# Patient Record
Sex: Female | Born: 1935
Health system: Southern US, Community
[De-identification: ages and names within clinical notes are randomized; demographics above are authoritative.]

## PROBLEM LIST (undated history)

## (undated) DIAGNOSIS — R0600 Dyspnea, unspecified: Secondary | ICD-10-CM

## (undated) DIAGNOSIS — R42 Dizziness and giddiness: Secondary | ICD-10-CM

## (undated) DIAGNOSIS — D649 Anemia, unspecified: Secondary | ICD-10-CM

## (undated) DIAGNOSIS — Z9889 Other specified postprocedural states: Secondary | ICD-10-CM

## (undated) DIAGNOSIS — I4891 Unspecified atrial fibrillation: Secondary | ICD-10-CM

## (undated) DIAGNOSIS — M5106 Intervertebral disc disorders with myelopathy, lumbar region: Secondary | ICD-10-CM

## (undated) DIAGNOSIS — R112 Nausea with vomiting, unspecified: Secondary | ICD-10-CM

## (undated) DIAGNOSIS — E039 Hypothyroidism, unspecified: Secondary | ICD-10-CM

## (undated) DIAGNOSIS — I10 Essential (primary) hypertension: Secondary | ICD-10-CM

## (undated) DIAGNOSIS — K219 Gastro-esophageal reflux disease without esophagitis: Secondary | ICD-10-CM

## (undated) DIAGNOSIS — M810 Age-related osteoporosis without current pathological fracture: Secondary | ICD-10-CM

## (undated) DIAGNOSIS — M1990 Unspecified osteoarthritis, unspecified site: Secondary | ICD-10-CM

## (undated) DIAGNOSIS — R011 Cardiac murmur, unspecified: Secondary | ICD-10-CM

## (undated) DIAGNOSIS — I639 Cerebral infarction, unspecified: Secondary | ICD-10-CM

## (undated) DIAGNOSIS — F419 Anxiety disorder, unspecified: Secondary | ICD-10-CM

## (undated) DIAGNOSIS — M5136 Other intervertebral disc degeneration, lumbar region: Secondary | ICD-10-CM

## (undated) DIAGNOSIS — E785 Hyperlipidemia, unspecified: Secondary | ICD-10-CM

## (undated) HISTORY — DX: Hypothyroidism, unspecified: E03.9

## (undated) HISTORY — DX: Cardiac murmur, unspecified: R01.1

## (undated) HISTORY — DX: Intervertebral disc disorders with myelopathy, lumbar region: M51.06

## (undated) HISTORY — DX: Hyperlipidemia, unspecified: E78.5

## (undated) HISTORY — PX: EYE SURGERY: SHX253

## (undated) HISTORY — PX: EXCISION MORTON'S NEUROMA: SHX5013

## (undated) HISTORY — DX: Age-related osteoporosis without current pathological fracture: M81.0

## (undated) HISTORY — DX: Unspecified atrial fibrillation: I48.91

---
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
## 1973-03-11 HISTORY — PX: ABDOMINAL HYSTERECTOMY: SHX81

## 2003-03-12 DIAGNOSIS — I639 Cerebral infarction, unspecified: Secondary | ICD-10-CM

## 2003-03-12 HISTORY — DX: Cerebral infarction, unspecified: I63.9

## 2003-05-05 ENCOUNTER — Other Ambulatory Visit: Payer: Self-pay

## 2003-12-22 ENCOUNTER — Emergency Department: Payer: Self-pay | Admitting: Unknown Physician Specialty

## 2003-12-22 ENCOUNTER — Other Ambulatory Visit: Payer: Self-pay

## 2004-02-06 ENCOUNTER — Ambulatory Visit: Payer: Self-pay | Admitting: Unknown Physician Specialty

## 2004-03-07 ENCOUNTER — Ambulatory Visit: Payer: Self-pay | Admitting: Internal Medicine

## 2004-03-16 ENCOUNTER — Ambulatory Visit: Payer: Self-pay | Admitting: Internal Medicine

## 2004-09-17 ENCOUNTER — Ambulatory Visit: Payer: Self-pay | Admitting: Internal Medicine

## 2005-04-03 ENCOUNTER — Ambulatory Visit: Payer: Self-pay | Admitting: Internal Medicine

## 2006-04-04 ENCOUNTER — Ambulatory Visit: Payer: Self-pay | Admitting: Internal Medicine

## 2007-04-07 ENCOUNTER — Ambulatory Visit: Payer: Self-pay | Admitting: Internal Medicine

## 2009-03-06 ENCOUNTER — Ambulatory Visit: Payer: Self-pay | Admitting: Internal Medicine

## 2009-08-03 ENCOUNTER — Ambulatory Visit: Payer: Self-pay | Admitting: Internal Medicine

## 2010-03-07 ENCOUNTER — Ambulatory Visit: Payer: Self-pay | Admitting: Internal Medicine

## 2010-07-11 ENCOUNTER — Ambulatory Visit: Payer: Self-pay | Admitting: Internal Medicine

## 2011-04-24 ENCOUNTER — Ambulatory Visit: Payer: Self-pay | Admitting: Internal Medicine

## 2011-12-14 ENCOUNTER — Emergency Department: Payer: Self-pay | Admitting: Emergency Medicine

## 2011-12-14 LAB — CBC
HCT: 27.5 % — ABNORMAL LOW (ref 35.0–47.0)
MCH: 31.4 pg (ref 26.0–34.0)
MCV: 92 fL (ref 80–100)
Platelet: 145 10*3/uL — ABNORMAL LOW (ref 150–440)
RDW: 14.3 % (ref 11.5–14.5)
WBC: 9.2 10*3/uL (ref 3.6–11.0)

## 2012-06-22 ENCOUNTER — Ambulatory Visit: Payer: Self-pay | Admitting: Internal Medicine

## 2012-09-05 ENCOUNTER — Ambulatory Visit: Payer: Self-pay | Admitting: Internal Medicine

## 2013-06-27 DIAGNOSIS — M5106 Intervertebral disc disorders with myelopathy, lumbar region: Secondary | ICD-10-CM | POA: Insufficient documentation

## 2013-06-27 DIAGNOSIS — I351 Nonrheumatic aortic (valve) insufficiency: Secondary | ICD-10-CM | POA: Insufficient documentation

## 2013-08-16 DIAGNOSIS — E039 Hypothyroidism, unspecified: Secondary | ICD-10-CM

## 2013-08-16 HISTORY — DX: Hypothyroidism, unspecified: E03.9

## 2015-08-29 DIAGNOSIS — I63321 Cerebral infarction due to thrombosis of right anterior cerebral artery: Secondary | ICD-10-CM | POA: Diagnosis not present

## 2015-08-29 DIAGNOSIS — M5106 Intervertebral disc disorders with myelopathy, lumbar region: Secondary | ICD-10-CM | POA: Diagnosis not present

## 2015-08-29 DIAGNOSIS — M810 Age-related osteoporosis without current pathological fracture: Secondary | ICD-10-CM | POA: Diagnosis not present

## 2015-08-29 DIAGNOSIS — I10 Essential (primary) hypertension: Secondary | ICD-10-CM | POA: Diagnosis not present

## 2015-08-29 DIAGNOSIS — E782 Mixed hyperlipidemia: Secondary | ICD-10-CM | POA: Diagnosis not present

## 2016-01-05 DIAGNOSIS — S3992XA Unspecified injury of lower back, initial encounter: Secondary | ICD-10-CM | POA: Diagnosis not present

## 2016-01-05 DIAGNOSIS — M5116 Intervertebral disc disorders with radiculopathy, lumbar region: Secondary | ICD-10-CM | POA: Diagnosis not present

## 2016-01-05 DIAGNOSIS — N3090 Cystitis, unspecified without hematuria: Secondary | ICD-10-CM | POA: Diagnosis not present

## 2016-01-10 ENCOUNTER — Other Ambulatory Visit: Payer: Self-pay | Admitting: Internal Medicine

## 2016-01-10 DIAGNOSIS — S22000A Wedge compression fracture of unspecified thoracic vertebra, initial encounter for closed fracture: Secondary | ICD-10-CM

## 2016-01-12 ENCOUNTER — Encounter (INDEPENDENT_AMBULATORY_CARE_PROVIDER_SITE_OTHER): Payer: Self-pay

## 2016-01-12 ENCOUNTER — Ambulatory Visit
Admission: RE | Admit: 2016-01-12 | Discharge: 2016-01-12 | Disposition: A | Discharge status: Home / Self Care | Payer: PPO | Source: Ambulatory Visit | Attending: Internal Medicine | Admitting: Internal Medicine

## 2016-01-12 DIAGNOSIS — M4854XA Collapsed vertebra, not elsewhere classified, thoracic region, initial encounter for fracture: Secondary | ICD-10-CM | POA: Diagnosis not present

## 2016-01-12 DIAGNOSIS — W19XXXA Unspecified fall, initial encounter: Secondary | ICD-10-CM | POA: Insufficient documentation

## 2016-01-12 DIAGNOSIS — S22080A Wedge compression fracture of T11-T12 vertebra, initial encounter for closed fracture: Secondary | ICD-10-CM | POA: Diagnosis not present

## 2016-01-12 DIAGNOSIS — S22000A Wedge compression fracture of unspecified thoracic vertebra, initial encounter for closed fracture: Secondary | ICD-10-CM

## 2016-01-15 ENCOUNTER — Encounter
Admission: RE | Admit: 2016-01-15 | Discharge: 2016-01-15 | Disposition: A | Discharge status: Home / Self Care | Payer: PPO | Source: Ambulatory Visit | Attending: Orthopedic Surgery | Admitting: Orthopedic Surgery

## 2016-01-15 DIAGNOSIS — E039 Hypothyroidism, unspecified: Secondary | ICD-10-CM | POA: Diagnosis not present

## 2016-01-15 DIAGNOSIS — M5136 Other intervertebral disc degeneration, lumbar region: Secondary | ICD-10-CM | POA: Diagnosis not present

## 2016-01-15 DIAGNOSIS — Z91013 Allergy to seafood: Secondary | ICD-10-CM | POA: Diagnosis not present

## 2016-01-15 DIAGNOSIS — Z8673 Personal history of transient ischemic attack (TIA), and cerebral infarction without residual deficits: Secondary | ICD-10-CM | POA: Diagnosis not present

## 2016-01-15 DIAGNOSIS — M4854XA Collapsed vertebra, not elsewhere classified, thoracic region, initial encounter for fracture: Secondary | ICD-10-CM | POA: Diagnosis not present

## 2016-01-15 DIAGNOSIS — F419 Anxiety disorder, unspecified: Secondary | ICD-10-CM | POA: Diagnosis not present

## 2016-01-15 DIAGNOSIS — R011 Cardiac murmur, unspecified: Secondary | ICD-10-CM | POA: Diagnosis not present

## 2016-01-15 DIAGNOSIS — I10 Essential (primary) hypertension: Secondary | ICD-10-CM | POA: Diagnosis not present

## 2016-01-15 DIAGNOSIS — M810 Age-related osteoporosis without current pathological fracture: Secondary | ICD-10-CM | POA: Diagnosis not present

## 2016-01-15 DIAGNOSIS — M1990 Unspecified osteoarthritis, unspecified site: Secondary | ICD-10-CM | POA: Diagnosis not present

## 2016-01-15 DIAGNOSIS — Z885 Allergy status to narcotic agent status: Secondary | ICD-10-CM | POA: Diagnosis not present

## 2016-01-15 DIAGNOSIS — D649 Anemia, unspecified: Secondary | ICD-10-CM | POA: Diagnosis not present

## 2016-01-15 DIAGNOSIS — E785 Hyperlipidemia, unspecified: Secondary | ICD-10-CM | POA: Diagnosis not present

## 2016-01-15 DIAGNOSIS — K219 Gastro-esophageal reflux disease without esophagitis: Secondary | ICD-10-CM | POA: Diagnosis not present

## 2016-01-15 HISTORY — DX: Dizziness and giddiness: R42

## 2016-01-15 HISTORY — DX: Dyspnea, unspecified: R06.00

## 2016-01-15 HISTORY — DX: Gastro-esophageal reflux disease without esophagitis: K21.9

## 2016-01-15 HISTORY — DX: Essential (primary) hypertension: I10

## 2016-01-15 HISTORY — DX: Anemia, unspecified: D64.9

## 2016-01-15 HISTORY — DX: Unspecified osteoarthritis, unspecified site: M19.90

## 2016-01-15 HISTORY — DX: Cerebral infarction, unspecified: I63.9

## 2016-01-15 HISTORY — DX: Anxiety disorder, unspecified: F41.9

## 2016-01-15 HISTORY — DX: Other intervertebral disc degeneration, lumbar region: M51.36

## 2016-01-15 HISTORY — DX: Other specified postprocedural states: R11.2

## 2016-01-15 HISTORY — DX: Hypothyroidism, unspecified: E03.9

## 2016-01-15 HISTORY — DX: Other specified postprocedural states: Z98.890

## 2016-01-15 LAB — CBC
HEMATOCRIT: 39.7 % (ref 35.0–47.0)
HEMOGLOBIN: 13.4 g/dL (ref 12.0–16.0)
MCH: 30.2 pg (ref 26.0–34.0)
MCHC: 33.7 g/dL (ref 32.0–36.0)
MCV: 89.7 fL (ref 80.0–100.0)
Platelets: 168 10*3/uL (ref 150–440)
RBC: 4.43 MIL/uL (ref 3.80–5.20)
RDW: 13.6 % (ref 11.5–14.5)
WBC: 7.1 10*3/uL (ref 3.6–11.0)

## 2016-01-15 LAB — BASIC METABOLIC PANEL
ANION GAP: 7 (ref 5–15)
BUN: 27 mg/dL — AB (ref 6–20)
CHLORIDE: 104 mmol/L (ref 101–111)
CO2: 26 mmol/L (ref 22–32)
Calcium: 10.2 mg/dL (ref 8.9–10.3)
Creatinine, Ser: 0.82 mg/dL (ref 0.44–1.00)
GFR calc Af Amer: >60 mL/min (ref >60)
GLUCOSE: 105 mg/dL — AB (ref 65–99)
POTASSIUM: 3.8 mmol/L (ref 3.5–5.1)
Sodium: 137 mmol/L (ref 135–145)

## 2016-01-15 LAB — SURGICAL PCR SCREEN
MRSA, PCR: NEGATIVE
Staphylococcus aureus: NEGATIVE

## 2016-01-15 NOTE — Patient Instructions (Signed)
  Your procedure is scheduled on: January 16, 2016 (Tuesday) Report to Same Day Surgery 2nd floor medical mall ARRIVAL TIME 11:00 AM   Remember: Instructions that are not followed completely may result in serious medical risk, up to and including death, or upon the discretion of your surgeon and anesthesiologist your surgery may need to be rescheduled.    _x___ 1. Do not eat food or drink liquids after midnight. No gum chewing or hard candies.     __x__ 2. No Alcohol for 24 hours before or after surgery.   __x__3. No Smoking for 24 prior to surgery.   ____  4. Bring all medications with you on the day of surgery if instructed.    __x__ 5. Notify your doctor if there is any change in your medical condition     (cold, fever, infections).     Do not wear jewelry, make-up, hairpins, clips or nail polish.  Do not wear lotions, powders, or perfumes. You may wear deodorant.  Do not shave 48 hours prior to surgery. Men may shave face and neck.  Do not bring valuables to the hospital.    [facility] is not responsible for any belongings or valuables.               Contacts, dentures or bridgework may not be worn into surgery.  Leave your suitcase in the car. After surgery it may be brought to your room.  For patients admitted to the hospital, discharge time is determined by your treatment team.   Patients discharged the day of surgery will not be allowed to drive home.    Please read over the following fact sheets that you were given:   [facility] Preparing for Surgery and or MRSA Information   _x___ Take these medicines the morning of surgery with A SIP OF WATER:    1. Amlodipine  2. Sertraline  3.  Levothyroxine  4. Omeprazole (Omeprazole at bedtime tonight)  5.  6.  ____Fleets enema or Magnesium Citrate as directed.   _x___ Use CHG Soap or sage wipes as directed on instruction sheet   ____ Use inhalers on the day of surgery and bring to hospital day of surgery  ____ Stop  metformin 2 days prior to surgery    ____ Take 1/2 of usual insulin dose the night before surgery and none on the morning of  surgery.           __x__ Stop aspirin or coumadin, or plavix  (NO ASPIRIN)  x__ Stop Anti-inflammatories such as Advil, Aleve, Ibuprofen, Motrin, Naproxen,          Naprosyn, Goodies powders or aspirin products. Ok to take Tylenol.   _x___ Stop supplements until after surgery.  (STOP VITAMIN C NOW)  ____ Bring C-Pap to the hospital.

## 2016-01-16 ENCOUNTER — Ambulatory Visit
Admission: RE | Admit: 2016-01-16 | Discharge: 2016-01-16 | Disposition: A | Discharge status: Home / Self Care | Payer: PPO | Source: Ambulatory Visit | Attending: Orthopedic Surgery | Admitting: Orthopedic Surgery

## 2016-01-16 ENCOUNTER — Ambulatory Visit: Payer: PPO | Admitting: Anesthesiology

## 2016-01-16 ENCOUNTER — Encounter
Admission: RE | Disposition: A | Discharge status: Home / Self Care | Payer: Self-pay | Source: Ambulatory Visit | Attending: Orthopedic Surgery

## 2016-01-16 ENCOUNTER — Ambulatory Visit: Payer: PPO

## 2016-01-16 ENCOUNTER — Encounter: Payer: Self-pay | Admitting: *Deleted

## 2016-01-16 DIAGNOSIS — Z885 Allergy status to narcotic agent status: Secondary | ICD-10-CM | POA: Insufficient documentation

## 2016-01-16 DIAGNOSIS — I10 Essential (primary) hypertension: Secondary | ICD-10-CM | POA: Diagnosis not present

## 2016-01-16 DIAGNOSIS — D649 Anemia, unspecified: Secondary | ICD-10-CM | POA: Insufficient documentation

## 2016-01-16 DIAGNOSIS — Z419 Encounter for procedure for purposes other than remedying health state, unspecified: Secondary | ICD-10-CM

## 2016-01-16 DIAGNOSIS — M1990 Unspecified osteoarthritis, unspecified site: Secondary | ICD-10-CM | POA: Insufficient documentation

## 2016-01-16 DIAGNOSIS — Z8673 Personal history of transient ischemic attack (TIA), and cerebral infarction without residual deficits: Secondary | ICD-10-CM | POA: Insufficient documentation

## 2016-01-16 DIAGNOSIS — M4854XA Collapsed vertebra, not elsewhere classified, thoracic region, initial encounter for fracture: Secondary | ICD-10-CM | POA: Diagnosis not present

## 2016-01-16 DIAGNOSIS — K219 Gastro-esophageal reflux disease without esophagitis: Secondary | ICD-10-CM | POA: Insufficient documentation

## 2016-01-16 DIAGNOSIS — M810 Age-related osteoporosis without current pathological fracture: Secondary | ICD-10-CM | POA: Insufficient documentation

## 2016-01-16 DIAGNOSIS — R011 Cardiac murmur, unspecified: Secondary | ICD-10-CM | POA: Insufficient documentation

## 2016-01-16 DIAGNOSIS — E039 Hypothyroidism, unspecified: Secondary | ICD-10-CM | POA: Insufficient documentation

## 2016-01-16 DIAGNOSIS — Z91013 Allergy to seafood: Secondary | ICD-10-CM | POA: Insufficient documentation

## 2016-01-16 DIAGNOSIS — E785 Hyperlipidemia, unspecified: Secondary | ICD-10-CM | POA: Insufficient documentation

## 2016-01-16 DIAGNOSIS — F419 Anxiety disorder, unspecified: Secondary | ICD-10-CM | POA: Insufficient documentation

## 2016-01-16 DIAGNOSIS — M5136 Other intervertebral disc degeneration, lumbar region: Secondary | ICD-10-CM | POA: Insufficient documentation

## 2016-01-16 DIAGNOSIS — S22080A Wedge compression fracture of T11-T12 vertebra, initial encounter for closed fracture: Secondary | ICD-10-CM | POA: Diagnosis not present

## 2016-01-16 HISTORY — PX: KYPHOPLASTY: SHX5884

## 2016-01-16 SURGERY — KYPHOPLASTY
Anesthesia: General | Wound class: Clean

## 2016-01-16 MED ORDER — IOPAMIDOL (ISOVUE-M 200) INJECTION 41%
INTRAMUSCULAR | Status: AC
  Filled 2016-01-16: qty 20

## 2016-01-16 MED ORDER — PROPOFOL 500 MG/50ML IV EMUL
INTRAVENOUS | Status: DC | PRN
  Administered 2016-01-16: 25 ug/kg/min via INTRAVENOUS

## 2016-01-16 MED ORDER — OXYCODONE HCL 5 MG PO TABS: 5.0000 mg | ORAL_TABLET | Freq: Once | ORAL | Status: DC | PRN

## 2016-01-16 MED ORDER — CEFAZOLIN SODIUM-DEXTROSE 2-4 GM/100ML-% IV SOLN
INTRAVENOUS | Status: AC
  Filled 2016-01-16: qty 100

## 2016-01-16 MED ORDER — MEPERIDINE HCL 25 MG/ML IJ SOLN: 6.2500 mg | INTRAMUSCULAR | Status: DC | PRN

## 2016-01-16 MED ORDER — FENTANYL CITRATE (PF) 100 MCG/2ML IJ SOLN: 25.0000 ug | INTRAMUSCULAR | Status: DC | PRN

## 2016-01-16 MED ORDER — IOPAMIDOL (ISOVUE-M 200) INJECTION 41%
INTRAMUSCULAR | Status: DC | PRN
  Administered 2016-01-16: 20 mL

## 2016-01-16 MED ORDER — BUPIVACAINE-EPINEPHRINE (PF) 0.5% -1:200000 IJ SOLN
INTRAMUSCULAR | Status: AC
  Filled 2016-01-16: qty 30

## 2016-01-16 MED ORDER — LACTATED RINGERS IV SOLN
INTRAVENOUS | Status: DC
  Administered 2016-01-16: 12:00:00 via INTRAVENOUS

## 2016-01-16 MED ORDER — HYDROCODONE-ACETAMINOPHEN 5-325 MG PO TABS: 1.0000 | ORAL_TABLET | Freq: Four times a day (QID) | ORAL | 0 refills | Status: DC | PRN

## 2016-01-16 MED ORDER — BUPIVACAINE-EPINEPHRINE (PF) 0.5% -1:200000 IJ SOLN
INTRAMUSCULAR | Status: DC | PRN
  Administered 2016-01-16: 10 mL

## 2016-01-16 MED ORDER — PROMETHAZINE HCL 25 MG/ML IJ SOLN: 6.2500 mg | INTRAMUSCULAR | Status: DC | PRN

## 2016-01-16 MED ORDER — LIDOCAINE HCL (PF) 1 % IJ SOLN
INTRAMUSCULAR | Status: AC
  Filled 2016-01-16: qty 60

## 2016-01-16 MED ORDER — ONDANSETRON HCL 4 MG/2ML IJ SOLN
INTRAMUSCULAR | Status: DC | PRN
  Administered 2016-01-16: 4 mg via INTRAVENOUS

## 2016-01-16 MED ORDER — LIDOCAINE HCL 1 % IJ SOLN
INTRAMUSCULAR | Status: DC | PRN
  Administered 2016-01-16: 20 mL

## 2016-01-16 MED ORDER — CEFAZOLIN SODIUM-DEXTROSE 2-4 GM/100ML-% IV SOLN
2.0000 g | Freq: Once | INTRAVENOUS | Status: AC
  Administered 2016-01-16: 2 g via INTRAVENOUS

## 2016-01-16 MED ORDER — OXYCODONE HCL 5 MG/5ML PO SOLN: 5.0000 mg | Freq: Once | ORAL | Status: DC | PRN

## 2016-01-16 SURGICAL SUPPLY — 15 items
CEMENT KYPHON CX01A KIT/MIXER (Cement) ×2 IMPLANT
DEVICE BIOPSY BONE KYPHX (INSTRUMENTS) ×2 IMPLANT
DRAPE C-ARM XRAY 36X54 (DRAPES) ×2 IMPLANT
DURAPREP 26ML APPLICATOR (WOUND CARE) ×2 IMPLANT
GLOVE SURG SYN 9.0  PF PI (GLOVE) ×1
GLOVE SURG SYN 9.0 PF PI (GLOVE) ×1 IMPLANT
GOWN SRG 2XL LVL 4 RGLN SLV (GOWNS) ×1 IMPLANT
GOWN STRL NON-REIN 2XL LVL4 (GOWNS) ×1
GOWN STRL REUS W/ TWL LRG LVL3 (GOWN DISPOSABLE) ×1 IMPLANT
GOWN STRL REUS W/TWL LRG LVL3 (GOWN DISPOSABLE) ×1
LIQUID BAND (GAUZE/BANDAGES/DRESSINGS) ×2 IMPLANT
PACK KYPHOPLASTY (MISCELLANEOUS) ×2 IMPLANT
STRAP SAFETY BODY (MISCELLANEOUS) ×2 IMPLANT
TRAY KYPHOPAK 15/3 EXPRESS 1ST (MISCELLANEOUS) ×2 IMPLANT
TRAY KYPHOPAK 20/3 EXPRESS 1ST (MISCELLANEOUS) IMPLANT

## 2016-01-16 NOTE — Anesthesia Preprocedure Evaluation (Signed)
Anesthesia Evaluation  Patient identified by MRN, date of birth, ID band Patient awake    Reviewed: Allergy & Precautions, NPO status , Patient's Chart, lab work & pertinent test results  History of Anesthesia Complications (+) PONV and history of anesthetic complications  Airway Mallampati: II  TM Distance: >3 FB Neck ROM: Full    Dental  (+) Poor Dentition   Pulmonary neg sleep apnea, neg COPD,    breath sounds clear to auscultation- rhonchi (-) wheezing      Cardiovascular hypertension, Pt. on medications (-) CAD and (-) Past MI  Rhythm:Regular Rate:Normal - Systolic murmurs and - Diastolic murmurs    Neuro/Psych Anxiety CVA, No Residual Symptoms    GI/Hepatic Neg liver ROS, GERD  ,  Endo/Other  neg diabetesHypothyroidism   Renal/GU negative Renal ROS     Musculoskeletal  (+) Arthritis ,   Abdominal (+) - obese,   Peds  Hematology  (+) anemia ,   Anesthesia Other Findings Past Medical History: No date: Anemia No date: Anxiety No date: Arthritis No date: DDD (degenerative disc disease), lumbar No date: Dyspnea     Comment: with exertion No date: GERD (gastroesophageal reflux disease) No date: Hypertension No date: Hypothyroidism No date: PONV (postoperative nausea and vomiting) 2005: Stroke (HCC) No date: Vertigo   Reproductive/Obstetrics                             Anesthesia Physical Anesthesia Plan  ASA: II  Anesthesia Plan: General   Post-op Pain Management:    Induction: Intravenous  Airway Management Planned: Natural Airway  Additional Equipment:   Intra-op Plan:   Post-operative Plan:   Informed Consent: I have reviewed the patients History and Physical, chart, labs and discussed the procedure including the risks, benefits and alternatives for the proposed anesthesia with the patient or authorized representative who has indicated his/her understanding and  acceptance.   Dental advisory given  Plan Discussed with: CRNA and Anesthesiologist  Anesthesia Plan Comments:         Anesthesia Quick Evaluation

## 2016-01-16 NOTE — Discharge Instructions (Addendum)
Activities as tolerated starting tomorrow. May remove Band-Aid on Thursday and shower at that time. No need to put new Band-Aid on unless desired   AMBULATORY SURGERY  DISCHARGE INSTRUCTIONS   1) The drugs that you were given will stay in your system until tomorrow so for the next 24 hours you should not:  A) Drive an automobile B) Make any legal decisions C) Drink any alcoholic beverage   2) You may resume regular meals tomorrow.  Today it is better to start with liquids and gradually work up to solid foods.  You may eat anything you prefer, but it is better to start with liquids, then soup and crackers, and gradually work up to solid foods.   3) Please notify your doctor immediately if you have any unusual bleeding, trouble breathing, redness and pain at the surgery site, drainage, fever, or pain not relieved by medication.    4) Additional Instructions:        Please contact your physician with any problems or Same Day Surgery at [phone], Monday through Friday 6 am to 4 pm, or [place] [facility] Main number at [phone].

## 2016-01-16 NOTE — Anesthesia Postprocedure Evaluation (Signed)
Anesthesia Post Note  Patient: Teresa Mosley  Procedure(s) Performed: Procedure(s) (LRB): KYPHOPLASTY T11 (N/A)  Patient location during evaluation: PACU Anesthesia Type: General Level of consciousness: awake and alert Pain management: pain level controlled Vital Signs Assessment: post-procedure vital signs reviewed and stable Respiratory status: spontaneous breathing and respiratory function stable Cardiovascular status: stable Anesthetic complications: no    Last Vitals:  Vitals:   01/16/16 1113 01/16/16 1535  BP: (!) 170/90 (!) 143/70  Pulse: 90 86  Resp: 18 20  Temp: 36.1 C 37.3 C    Last Pain:  Vitals:   01/16/16 1535  TempSrc: Temporal  PainSc: 0-No pain                 Story Conti K

## 2016-01-16 NOTE — Op Note (Signed)
01/16/2016  3:33 PM  PATIENT:  Teresa ShuttersMargaret J Mosley  82 y.o. female  PRE-OPERATIVE DIAGNOSIS:  t11 compression fracture  POST-OPERATIVE DIAGNOSIS:  t11 compression fracture  PROCEDURE:  Procedure(s): KYPHOPLASTY T11 (N/A)  SURGEON: Leitha SchullerMichael J Kanda Deluna, MD  ASSISTANTS: None  ANESTHESIA:   local and MAC  EBL:  No intake/output data recorded.  BLOOD ADMINISTERED:none  DRAINS: none   LOCAL MEDICATIONS USED:  MARCAINE    and XYLOCAINE   SPECIMEN:  No Specimen  DISPOSITION OF SPECIMEN:  N/A  COUNTS:  YES  TOURNIQUET:  * No tourniquets in log *  IMPLANTS: Bone cement  DICTATION: .Dragon Dictation patient brought the operating room and after adequate anesthesia was obtained patient was placed prone. After positioning appropriately, C-arm was brought in and both AP and lateral images. The causation was obtained of T11. After appropriate patient identification and timeout procedures were completed, local anesthesia was infiltrated down to the skin subcutaneously on both sides at T11 after prepping the skin. The back was then prepped and draped in sterile fashion and repeat timeout procedure carried out. Spinal needle was used to get down to the pedicle on the right at T11 and a 50-50 mix of 1% Xylocaine, half percent Sensorcaine with epinephrine was infiltrated to for the pedicle to the subcutaneous tissue approximately 20 cc total. After allowing this to set a small incision was made and a trocar advanced and extrapedicular fashion. Biopsy was attempted but nothing obtained. Drilling was then carried out and a balloon inserted. 3 cc inflation gave partial correction of the superior endplate deformity. Cement was mixed and when it was the appropriate consistency approximate 3-1/2 cc infiltrated into T11. There was no extravasation. There is good fill of the entire body. After allowing this to set the trochars removed and permanent AP lateral images obtained. The skin was closed with Dermabond  closure followed by a Band-Aid  PLAN OF CARE: Discharge to home after PACU  PATIENT DISPOSITION:  PACU - hemodynamically stable.

## 2016-01-16 NOTE — Transfer of Care (Signed)
Immediate Anesthesia Transfer of Care Note  Patient: Teresa Mosley  Procedure(s) Performed: Procedure(s): KYPHOPLASTY T11 (N/A)  Patient Location: PACU  Anesthesia Type:MAC  Level of Consciousness: awake, alert  and oriented  Airway & Oxygen Therapy: Patient connected to nasal cannula oxygen  Post-op Assessment: Post -op Vital signs reviewed and stable  Post vital signs: stable  Last Vitals:  Vitals:   01/16/16 1113 01/16/16 1535  BP: (!) 170/90 (!) 143/70  Pulse: 90 86  Resp: 18   Temp: 36.1 C 37.3 C    Last Pain:  Vitals:   01/16/16 1535  TempSrc: Temporal  PainSc:          Complications: No apparent anesthesia complications

## 2016-01-16 NOTE — H&P (Signed)
Reviewed paper H+P, will be scanned into chart. No changes noted.  

## 2016-01-17 ENCOUNTER — Encounter: Payer: Self-pay | Admitting: Orthopedic Surgery

## 2016-01-23 ENCOUNTER — Ambulatory Visit: Payer: Self-pay

## 2016-01-31 DIAGNOSIS — S22080A Wedge compression fracture of T11-T12 vertebra, initial encounter for closed fracture: Secondary | ICD-10-CM | POA: Diagnosis not present

## 2016-03-01 DIAGNOSIS — E782 Mixed hyperlipidemia: Secondary | ICD-10-CM | POA: Diagnosis not present

## 2016-03-01 DIAGNOSIS — E538 Deficiency of other specified B group vitamins: Secondary | ICD-10-CM | POA: Diagnosis not present

## 2016-03-01 DIAGNOSIS — E039 Hypothyroidism, unspecified: Secondary | ICD-10-CM | POA: Diagnosis not present

## 2016-03-01 DIAGNOSIS — I63321 Cerebral infarction due to thrombosis of right anterior cerebral artery: Secondary | ICD-10-CM | POA: Diagnosis not present

## 2016-03-08 ENCOUNTER — Other Ambulatory Visit: Payer: Self-pay | Admitting: Internal Medicine

## 2016-03-08 DIAGNOSIS — M5106 Intervertebral disc disorders with myelopathy, lumbar region: Secondary | ICD-10-CM

## 2016-03-08 DIAGNOSIS — M545 Low back pain: Secondary | ICD-10-CM | POA: Diagnosis not present

## 2016-03-08 DIAGNOSIS — Z0000 Encounter for general adult medical examination without abnormal findings: Secondary | ICD-10-CM | POA: Diagnosis not present

## 2016-03-08 DIAGNOSIS — M818 Other osteoporosis without current pathological fracture: Secondary | ICD-10-CM | POA: Diagnosis not present

## 2016-03-08 DIAGNOSIS — S22000A Wedge compression fracture of unspecified thoracic vertebra, initial encounter for closed fracture: Secondary | ICD-10-CM | POA: Insufficient documentation

## 2016-03-25 DIAGNOSIS — M818 Other osteoporosis without current pathological fracture: Secondary | ICD-10-CM | POA: Diagnosis not present

## 2016-03-25 DIAGNOSIS — M810 Age-related osteoporosis without current pathological fracture: Secondary | ICD-10-CM | POA: Diagnosis not present

## 2016-04-23 ENCOUNTER — Other Ambulatory Visit: Payer: Self-pay | Admitting: Physical Medicine and Rehabilitation

## 2016-04-23 DIAGNOSIS — M48062 Spinal stenosis, lumbar region with neurogenic claudication: Secondary | ICD-10-CM

## 2016-04-23 DIAGNOSIS — M5136 Other intervertebral disc degeneration, lumbar region: Secondary | ICD-10-CM | POA: Diagnosis not present

## 2016-04-23 DIAGNOSIS — M5416 Radiculopathy, lumbar region: Secondary | ICD-10-CM | POA: Diagnosis not present

## 2016-04-23 DIAGNOSIS — M62830 Muscle spasm of back: Secondary | ICD-10-CM | POA: Diagnosis not present

## 2016-04-29 DIAGNOSIS — M5106 Intervertebral disc disorders with myelopathy, lumbar region: Secondary | ICD-10-CM | POA: Diagnosis not present

## 2016-05-08 ENCOUNTER — Ambulatory Visit: Payer: PPO

## 2016-05-22 ENCOUNTER — Emergency Department
Admission: EM | Admit: 2016-05-22 | Discharge: 2016-05-23 | Disposition: A | Discharge status: Home / Self Care | Payer: PPO | Attending: Emergency Medicine | Admitting: Emergency Medicine

## 2016-05-22 ENCOUNTER — Encounter: Payer: Self-pay | Admitting: *Deleted

## 2016-05-22 ENCOUNTER — Emergency Department: Payer: PPO

## 2016-05-22 DIAGNOSIS — E039 Hypothyroidism, unspecified: Secondary | ICD-10-CM | POA: Diagnosis not present

## 2016-05-22 DIAGNOSIS — I160 Hypertensive urgency: Secondary | ICD-10-CM | POA: Diagnosis not present

## 2016-05-22 DIAGNOSIS — R42 Dizziness and giddiness: Secondary | ICD-10-CM

## 2016-05-22 DIAGNOSIS — Z79899 Other long term (current) drug therapy: Secondary | ICD-10-CM | POA: Insufficient documentation

## 2016-05-22 DIAGNOSIS — I10 Essential (primary) hypertension: Secondary | ICD-10-CM | POA: Diagnosis not present

## 2016-05-22 LAB — URINALYSIS, COMPLETE (UACMP) WITH MICROSCOPIC
BACTERIA UA: NONE SEEN
BILIRUBIN URINE: NEGATIVE
Glucose, UA: NEGATIVE mg/dL
HGB URINE DIPSTICK: NEGATIVE
Ketones, ur: NEGATIVE mg/dL
LEUKOCYTES UA: NEGATIVE
NITRITE: NEGATIVE
Protein, ur: 30 mg/dL — AB
SPECIFIC GRAVITY, URINE: 1.015 (ref 1.005–1.030)
pH: 6 (ref 5.0–8.0)

## 2016-05-22 LAB — BASIC METABOLIC PANEL
ANION GAP: 7 (ref 5–15)
BUN: 24 mg/dL — ABNORMAL HIGH (ref 6–20)
CO2: 28 mmol/L (ref 22–32)
Calcium: 9.9 mg/dL (ref 8.9–10.3)
Chloride: 103 mmol/L (ref 101–111)
Creatinine, Ser: 0.76 mg/dL (ref 0.44–1.00)
GFR calc Af Amer: >60 mL/min (ref >60)
GFR calc non Af Amer: >60 mL/min (ref >60)
Glucose, Bld: 145 mg/dL — ABNORMAL HIGH (ref 65–99)
POTASSIUM: 3.6 mmol/L (ref 3.5–5.1)
SODIUM: 138 mmol/L (ref 135–145)

## 2016-05-22 LAB — CBC
HEMATOCRIT: 43.4 % (ref 35.0–47.0)
HEMOGLOBIN: 14.5 g/dL (ref 12.0–16.0)
MCH: 31.2 pg (ref 26.0–34.0)
MCHC: 33.4 g/dL (ref 32.0–36.0)
MCV: 93.6 fL (ref 80.0–100.0)
Platelets: 146 10*3/uL — ABNORMAL LOW (ref 150–440)
RBC: 4.64 MIL/uL (ref 3.80–5.20)
RDW: 13.6 % (ref 11.5–14.5)
WBC: 6 10*3/uL (ref 3.6–11.0)

## 2016-05-22 LAB — TROPONIN I

## 2016-05-22 MED ORDER — AMLODIPINE BESYLATE 5 MG PO TABS
5.0000 mg | ORAL_TABLET | Freq: Once | ORAL | Status: AC
  Administered 2016-05-22: 5 mg via ORAL

## 2016-05-22 MED ORDER — AMLODIPINE BESYLATE 5 MG PO TABS
ORAL_TABLET | ORAL | Status: AC
  Administered 2016-05-22: 5 mg via ORAL
  Filled 2016-05-22: qty 1

## 2016-05-22 NOTE — ED Notes (Signed)
MRI tech will not be available until 11:00. Patient made aware.

## 2016-05-22 NOTE — ED Notes (Signed)
Patient transported to CT 

## 2016-05-22 NOTE — ED Triage Notes (Signed)
Pt reports having dizziness and lightheadedness nausea, and HTN beginning yesterday. Pt reports symptoms subsided and pt was told to follow up with PCP. Pt has appointment for tomorrow but reports having same symptoms with vomiting today. Pt reports symptoms have since subsided again. Pt is alert and oriented. Pt reports taking HTN medication as prescribed, no new changes.

## 2016-05-22 NOTE — ED Provider Notes (Signed)
[facility] Emergency Department Provider Note   ____________________________________________   First MD Initiated Contact with Patient 05/22/16 2048     (approximate)  I have reviewed the triage vital signs and the nursing notes.   HISTORY  Chief Complaint Hypertension and Dizziness   HPI Andres EgeMargaret J Clyatt is a 83 y.o. female here for evaluation of feeling dizzy and lightheaded over the last 2 days  The patient and her family report that 2 days ago she felt nauseated and slightly "dizzy" which she finds somewhat hard describe but describes a feeling of fatigue and lightheadedness. Her blood pressure home are quite high, over 200 on her home meter. She called paramedics and was offered transport, but she felt better when they arrived and instead was going to see her doctor today, but when she called Dr. Rondel BatonMiller's office they could not see her for follow-up  This afternoon, her symptoms came back or she felt lightheaded, dizzy and she again knows her blood pressure was over 200 on her home meter, and she took both of her blood pressure medicines this morning is normal. She did not have a headache. No fevers or chills. She did vomit once while feeling nauseated. She had no chest pain or trouble breathing. Denies any abdominal pain or other symptoms  She walks using a 4 leg walker at baseline, and this is not changed.  She had a previous stroke and about 2005   Past Medical History:  Diagnosis Date  . Anemia   . Anxiety   . Arthritis   . DDD (degenerative disc disease), lumbar   . Dyspnea    with exertion  . GERD (gastroesophageal reflux disease)   . Hypertension   . Hypothyroidism   . PONV (postoperative nausea and vomiting)   . Stroke (HCC) 2005  . Vertigo     There are no active problems to display for this patient.   Past Surgical History:  Procedure Laterality Date  . ABDOMINAL HYSTERECTOMY    . APPENDECTOMY    . EXCISION MORTON'S  NEUROMA Bilateral   . EYE SURGERY Bilateral    Cataract Extraction with IOL  . KYPHOPLASTY N/A 01/16/2016   Procedure: KYPHOPLASTY T11;  Surgeon: Kennedy BuckerMichael Menz, MD;  Location: [facility] ORS;  Service: Orthopedics;  Laterality: N/A;    Prior to Admission medications   Medication Sig Start Date End Date Taking? Authorizing Provider  amLODipine (NORVASC) 5 MG tablet Take 5 mg by mouth daily.    Historical Provider, MD  cholecalciferol (VITAMIN D) 1000 units tablet Take 1,000 Units by mouth daily.    Historical Provider, MD  HYDROcodone-acetaminophen (NORCO) 5-325 MG tablet Take 1 tablet by mouth every 6 (six) hours as needed for moderate pain. 01/16/16   Kennedy BuckerMichael Menz, MD  HYDROcodone-acetaminophen (NORCO/VICODIN) 5-325 MG tablet Take 1 tablet by mouth 3 (three) times daily. For ten days    Historical Provider, MD  levothyroxine (SYNTHROID, LEVOTHROID) 112 MCG tablet Take 112 mcg by mouth daily before breakfast.    Historical Provider, MD  omeprazole (PRILOSEC) 20 MG capsule Take 20 mg by mouth daily.    Historical Provider, MD  sertraline (ZOLOFT) 25 MG tablet Take 25 mg by mouth daily.    Historical Provider, MD  vitamin C (ASCORBIC ACID) 500 MG tablet Take 500 mg by mouth daily.    Historical Provider, MD    Allergies Codeine and Shellfish allergy  History reviewed. No pertinent family history.  Social History Social History  Substance Use Topics  .  Smoking status: Never Smoker  . Smokeless tobacco: Never Used  . Alcohol use Yes     Comment: occassional wine    Review of Systems Constitutional: No fever/chills Eyes: No visual changes. ENT: No sore throat. Cardiovascular: Denies chest pain. Respiratory: Denies shortness of breath. Gastrointestinal: No abdominal pain.   No diarrhea.  No constipation. Genitourinary: Negative for dysuria. Musculoskeletal: Negative for back pain. Skin: Negative for rash. Neurological: Negative for headaches, focal weakness or numbness.  10-point ROS  otherwise negative.  ____________________________________________   PHYSICAL EXAM:  VITAL SIGNS: ED Triage Vitals  Enc Vitals Group     BP 05/22/16 1712 (!) 186/86     Pulse Rate 05/22/16 1712 80     Resp 05/22/16 1712 16     Temp 05/22/16 1712 97.7 F (36.5 C)     Temp Source 05/22/16 1712 Oral     SpO2 05/22/16 1712 96 %     Weight 05/22/16 1710 125 lb (56.7 kg)     Height 05/22/16 1710 5\' 3"  (1.6 m)     Head Circumference --      Peak Flow --      Pain Score --      Pain Loc --      Pain Edu? --      Excl. in GC? --     Constitutional: Alert and oriented. Well appearing and in no acute distress. Eyes: Conjunctivae are normal. PERRL. EOMI. Head: Atraumatic. Nose: No congestion/rhinnorhea. Mouth/Throat: Mucous membranes are moist.  Oropharynx non-erythematous. Neck: No stridor.   Cardiovascular: Normal rate, regular rhythm. Grossly normal heart sounds.  Good peripheral circulation. Respiratory: Normal respiratory effort.  No retractions. Lungs CTAB. Gastrointestinal: Soft and nontender. No distention.  Musculoskeletal: No lower extremity tenderness nor edema.  No joint effusions. Neurologic:  Normal speech and language. No gross focal neurologic deficits are appreciated.   The patient has no pronator drift. The patient has normal cranial nerve exam. Extraocular movements are normal. Visual fields are normal. Patient has 5 out of 5 strength in all extremities. There is no numbness or gross, acute sensory abnormality in the extremities bilaterally. No speech disturbance. No dysarthria. No aphasia. No ataxia. Patient speaking in full and clear sentences. Skin:  Skin is warm, dry and intact. No rash noted. Psychiatric: Mood and affect are normal. Speech and behavior are normal.  ____________________________________________   LABS (all labs ordered are listed, but only abnormal results are displayed)  Labs Reviewed  BASIC METABOLIC PANEL - Abnormal; Notable for  the following:       Result Value   Glucose, Bld 145 (*)    BUN 24 (*)    All other components within normal limits  CBC - Abnormal; Notable for the following:    Platelets 146 (*)    All other components within normal limits  URINALYSIS, COMPLETE (UACMP) WITH MICROSCOPIC - Abnormal; Notable for the following:    Color, Urine YELLOW (*)    APPearance CLEAR (*)    Protein, ur 30 (*)    Squamous Epithelial / LPF 0-5 (*)    All other components within normal limits  URINE CULTURE  TROPONIN I  CBG MONITORING, ED   ____________________________________________  EKG  Reviewed injury by me at 1720 Ventricular rate 90 Care is 90 QTc 420 Normal sinus rhythm, no evidence of ischemic change. Some slight baseline artifact ____________________________________________  RADIOLOGY  Ct Head Wo Contrast  Result Date: 05/22/2016 CLINICAL DATA:  83 y/o F; elevated blood pressure with vomiting,  dizziness, and lightheadedness. EXAM: CT HEAD WITHOUT CONTRAST TECHNIQUE: Contiguous axial images were obtained from the base of the skull through the vertex without intravenous contrast. COMPARISON:  12/22/2003 CT of the head. FINDINGS: Brain: No evidence of acute infarction, hemorrhage, hydrocephalus, extra-axial collection or mass lesion/mass effect. Interval small chronic cortical infarct in the right parietal lobe. Advanced chronic microvascular ischemic changes and moderate brain parenchymal volume loss progressed from prior CT. Stable chronic lacunar infarcts within the right anterior lentiform nucleus, right thalamus. Vascular: Extensive calcific atherosclerosis of cavernous and paraclinoid internal carotid arteries as well as the vertebrobasilar system. Skull: Normal. Negative for fracture or focal lesion. Sinuses/Orbits: No acute finding. Other: None. IMPRESSION: 1. No acute intracranial abnormality identified. 2. Progression of advanced chronic microvascular ischemic changes and moderate parenchymal  volume loss of the brain. Interval small chronic cortical infarct in the right parietal lobe. Electronically Signed   By: Mitzi Hansen M.D.   On: 05/22/2016 21:39    ____________________________________________   PROCEDURES  Procedure(s) performed: None  Procedures  Critical Care performed: No  ____________________________________________   INITIAL IMPRESSION / ASSESSMENT AND PLAN / ED COURSE  Pertinent labs & imaging results that were available during my care of the patient were reviewed by me and considered in my medical decision making (see chart for details).  Patient ridge for evaluation of notable elevated blood pressure home despite compliant with her medications. In addition somewhat hard to describe recurrence twice the last 24 hours of feeling slightly nauseated, vomiting once, and feeling "dizzy" and slightly lightheaded. On exam she uses no abdominal pain and denies any abdominal symptoms at this time. Her neurologic exam is intact and reassuring, but she has had notable elevated hypertension. No retinal hemorrhages are noted. No evidence of end organ damage by labs. CT scan of the head demonstrates a chronic appearing, but new stroke compared with her previous CT from over 10 years ago. The clinical significance of this somewhat hard to discern, and I spoke with Dr. Amada Jupiter neurology recommends obtaining an MRI of the brain, which I ordered.  Patient passed bedside swallow. She is given amlodipine 5 mg, as an additional dose for slow but intentional blood pressure lowering. Remaining labs are very reassuring. Patient and family agreeable with the plan for MRI  ----------------------------------------- 10:20 PM on 05/22/2016 -----------------------------------------  Ongoing care and disposition assigned to Dr. Lamont Snowball. The patient being evaluated for hypertension with "dizzy" feeling without evidence of acute neurologic deficit noted. MRI of the brain is  pending, if no evidence of an acute process or subacute process would recommend evaluate for discharge home once blood pressure improved and follow-up with Dr. Hyacinth Meeker at 10AM (has appointment).  Do not feel the patient requires a prescription for blood pressure improves with amlodipine as she has a follow-up appointment are rescheduled for 10 AM with Dr. Hyacinth Meeker, and further modification of medications and blood pressure rechecked and reevaluation of her symptoms can be performed at that time.      ____________________________________________   FINAL CLINICAL IMPRESSION(S) / ED DIAGNOSES  Final diagnoses:  Hypertensive urgency  Dizziness      NEW MEDICATIONS STARTED DURING THIS VISIT:  New Prescriptions   No medications on file     Note:  This document was prepared using Dragon voice recognition software and may include unintentional dictation errors.     Sharyn Creamer, MD 05/22/16 [phone]

## 2016-05-22 NOTE — ED Notes (Signed)
ED Provider at bedside. 

## 2016-05-22 NOTE — ED Notes (Signed)
MRI notified of patient waiting.

## 2016-05-22 NOTE — ED Notes (Signed)
Patient given meal tray.

## 2016-05-22 NOTE — Discharge Instructions (Addendum)
As we discussed, though you do have high blood pressure (hypertension), fortunately it is not immediately dangerous at this time and does not need emergency intervention or admission to the hospital.  If we add to or change your regular medications, we may cause more harm than good - it is more appropriate for your primary care doctor to evaluate you in clinic at 10AM TODAY and decide if any medication changes are needed.  Please follow up in clinic as recommended in these papers.  Return to the Emergency Department (ED) if you experience any chest pain/pressure/tightness, weakness, abdominal pain, fever, recurrent vomiting, trouble speaking or walking, difficulty breathing, or sudden sweating, or other symptoms that concern you.  Results for orders placed or performed during the hospital encounter of 05/22/16  Basic metabolic panel  Result Value Ref Range   Sodium 138 135 - 145 mmol/L   Potassium 3.6 3.5 - 5.1 mmol/L   Chloride 103 101 - 111 mmol/L   CO2 28 22 - 32 mmol/L   Glucose, Bld 145 (H) 65 - 99 mg/dL   BUN 24 (H) 6 - 20 mg/dL   Creatinine, Ser 0.860.76 0.44 - 1.00 mg/dL   Calcium 9.9 8.9 - 57.810.3 mg/dL   GFR calc non Af Amer >60 >60 mL/min   GFR calc Af Amer >60 >60 mL/min   Anion gap 7 5 - 15  CBC  Result Value Ref Range   WBC 6.0 3.6 - 11.0 K/uL   RBC 4.64 3.80 - 5.20 MIL/uL   Hemoglobin 14.5 12.0 - 16.0 g/dL   HCT 46.943.4 62.935.0 - 52.847.0 %   MCV 93.6 80.0 - 100.0 fL   MCH 31.2 26.0 - 34.0 pg   MCHC 33.4 32.0 - 36.0 g/dL   RDW 41.313.6 24.411.5 - 01.014.5 %   Platelets 146 (L) 150 - 440 K/uL  Urinalysis, Complete w Microscopic  Result Value Ref Range   Color, Urine YELLOW (A) YELLOW   APPearance CLEAR (A) CLEAR   Specific Gravity, Urine 1.015 1.005 - 1.030   pH 6.0 5.0 - 8.0   Glucose, UA NEGATIVE NEGATIVE mg/dL   Hgb urine dipstick NEGATIVE NEGATIVE   Bilirubin Urine NEGATIVE NEGATIVE   Ketones, ur NEGATIVE NEGATIVE mg/dL   Protein, ur 30 (A) NEGATIVE mg/dL   Nitrite NEGATIVE NEGATIVE    Leukocytes, UA NEGATIVE NEGATIVE   RBC / HPF 0-5 0 - 5 RBC/hpf   WBC, UA 0-5 0 - 5 WBC/hpf   Bacteria, UA NONE SEEN NONE SEEN   Squamous Epithelial / LPF 0-5 (A) NONE SEEN   Mucous PRESENT   Troponin I  Result Value Ref Range   Troponin I <0.03 <0.03 ng/mL   Ct Head Wo Contrast  Result Date: 05/22/2016 CLINICAL DATA:  83 y/o F; elevated blood pressure with vomiting, dizziness, and lightheadedness. EXAM: CT HEAD WITHOUT CONTRAST TECHNIQUE: Contiguous axial images were obtained from the base of the skull through the vertex without intravenous contrast. COMPARISON:  12/22/2003 CT of the head. FINDINGS: Brain: No evidence of acute infarction, hemorrhage, hydrocephalus, extra-axial collection or mass lesion/mass effect. Interval small chronic cortical infarct in the right parietal lobe. Advanced chronic microvascular ischemic changes and moderate brain parenchymal volume loss progressed from prior CT. Stable chronic lacunar infarcts within the right anterior lentiform nucleus, right thalamus. Vascular: Extensive calcific atherosclerosis of cavernous and paraclinoid internal carotid arteries as well as the vertebrobasilar system. Skull: Normal. Negative for fracture or focal lesion. Sinuses/Orbits: No acute finding. Other: None. IMPRESSION: 1. No acute  intracranial abnormality identified. 2. Progression of advanced chronic microvascular ischemic changes and moderate parenchymal volume loss of the brain. Interval small chronic cortical infarct in the right parietal lobe. Electronically Signed   By: Mitzi Hansen M.D.   On: 05/22/2016 21:39   Mr Brain Wo Contrast  Result Date: 05/23/2016 CLINICAL DATA:  Dizziness, lightheadedness, nausea and hypertension today. Assess new stroke. EXAM: MRI HEAD WITHOUT CONTRAST TECHNIQUE: Multiplanar, multiecho pulse sequences of the brain and surrounding structures were obtained without intravenous contrast. COMPARISON:  CT HEAD May 22, 2016 FINDINGS: BRAIN: No  reduced diffusion to suggest acute ischemia. No susceptibility artifact to suggest hemorrhage. Small areas encephalomalacia RIGHT temporal lobe, LEFT occipital lobe, RIGHT parietal lobe. Old small bilateral cerebellar infarcts. Old RIGHT thalamus and bilateral basal ganglia infarcts. Confluent supratentorial white matter FLAIR T2 hyperintensities. Ventricles and sulci are normal for patient's age. No midline shift, mass effect or masses. No abnormal extra-axial fluid collections. VASCULAR: Dolichoectatic intracranial vessels seen with chronic hypertension. SKULL AND UPPER CERVICAL SPINE: No abnormal sellar expansion. No suspicious calvarial bone marrow signal. Craniocervical junction maintained. SINUSES/ORBITS: The mastoid air-cells and included paranasal sinuses are well-aerated. The included ocular globes and orbital contents are non-suspicious. Status post bilateral ocular lens implants. OTHER: None. IMPRESSION: No acute intracranial process. Multiple old small infarcts (RIGHT MCA territory and LEFT watershed), old small cerebellar and deep gray nuclei infarcts. Moderate to severe chronic small vessel ischemic disease. Electronically Signed   By: Awilda Metro M.D.   On: 05/23/2016 01:52

## 2016-05-23 ENCOUNTER — Emergency Department: Payer: PPO

## 2016-05-23 DIAGNOSIS — R110 Nausea: Secondary | ICD-10-CM | POA: Diagnosis not present

## 2016-05-23 DIAGNOSIS — I10 Essential (primary) hypertension: Secondary | ICD-10-CM | POA: Diagnosis not present

## 2016-05-23 DIAGNOSIS — I6300 Cerebral infarction due to thrombosis of unspecified precerebral artery: Secondary | ICD-10-CM | POA: Diagnosis not present

## 2016-05-23 DIAGNOSIS — R42 Dizziness and giddiness: Secondary | ICD-10-CM | POA: Diagnosis not present

## 2016-05-23 NOTE — ED Notes (Signed)
MRT asking patient pre-procedure questionnaire.

## 2016-05-23 NOTE — ED Provider Notes (Signed)
Care signed over from Dr. Fanny BienQuale pending MRI. MRI read with no acute disease. She remains stable for discharge as planned. She has follow-up today.   Merrily BrittleNeil Letecia Arps, MD 05/23/16 [phone]

## 2016-05-23 NOTE — ED Notes (Signed)
Patient transported to MRI 

## 2016-05-23 NOTE — ED Notes (Signed)
MRI says they are ready for patient, called Kennedy Buckerhanh to take and sit with patient.

## 2016-05-24 LAB — URINE CULTURE: Special Requests: NORMAL

## 2016-06-12 DIAGNOSIS — M5106 Intervertebral disc disorders with myelopathy, lumbar region: Secondary | ICD-10-CM | POA: Diagnosis not present

## 2016-06-12 DIAGNOSIS — I10 Essential (primary) hypertension: Secondary | ICD-10-CM | POA: Diagnosis not present

## 2016-06-13 ENCOUNTER — Ambulatory Visit
Admission: RE | Admit: 2016-06-13 | Discharge: 2016-06-13 | Disposition: A | Discharge status: Home / Self Care | Payer: PPO | Source: Ambulatory Visit | Attending: Physical Medicine and Rehabilitation | Admitting: Physical Medicine and Rehabilitation

## 2016-06-13 DIAGNOSIS — M5416 Radiculopathy, lumbar region: Secondary | ICD-10-CM | POA: Diagnosis not present

## 2016-06-13 DIAGNOSIS — M5126 Other intervertebral disc displacement, lumbar region: Secondary | ICD-10-CM | POA: Diagnosis not present

## 2016-06-13 DIAGNOSIS — M51369 Other intervertebral disc degeneration, lumbar region without mention of lumbar back pain or lower extremity pain: Secondary | ICD-10-CM

## 2016-06-13 DIAGNOSIS — M4316 Spondylolisthesis, lumbar region: Secondary | ICD-10-CM | POA: Diagnosis not present

## 2016-06-13 DIAGNOSIS — M5136 Other intervertebral disc degeneration, lumbar region: Secondary | ICD-10-CM | POA: Diagnosis not present

## 2016-06-13 DIAGNOSIS — M48062 Spinal stenosis, lumbar region with neurogenic claudication: Secondary | ICD-10-CM

## 2016-06-13 DIAGNOSIS — M545 Low back pain: Secondary | ICD-10-CM | POA: Diagnosis not present

## 2016-06-13 DIAGNOSIS — M48061 Spinal stenosis, lumbar region without neurogenic claudication: Secondary | ICD-10-CM | POA: Diagnosis not present

## 2016-06-13 DIAGNOSIS — M47816 Spondylosis without myelopathy or radiculopathy, lumbar region: Secondary | ICD-10-CM | POA: Insufficient documentation

## 2016-06-25 DIAGNOSIS — M47816 Spondylosis without myelopathy or radiculopathy, lumbar region: Secondary | ICD-10-CM | POA: Diagnosis not present

## 2016-07-25 DIAGNOSIS — M47816 Spondylosis without myelopathy or radiculopathy, lumbar region: Secondary | ICD-10-CM | POA: Diagnosis not present

## 2016-08-29 DIAGNOSIS — Z0000 Encounter for general adult medical examination without abnormal findings: Secondary | ICD-10-CM | POA: Diagnosis not present

## 2016-09-05 DIAGNOSIS — M5106 Intervertebral disc disorders with myelopathy, lumbar region: Secondary | ICD-10-CM | POA: Diagnosis not present

## 2016-09-05 DIAGNOSIS — E782 Mixed hyperlipidemia: Secondary | ICD-10-CM | POA: Diagnosis not present

## 2016-09-05 DIAGNOSIS — E538 Deficiency of other specified B group vitamins: Secondary | ICD-10-CM | POA: Diagnosis not present

## 2016-09-05 DIAGNOSIS — Z0000 Encounter for general adult medical examination without abnormal findings: Secondary | ICD-10-CM | POA: Insufficient documentation

## 2016-09-05 DIAGNOSIS — M818 Other osteoporosis without current pathological fracture: Secondary | ICD-10-CM | POA: Diagnosis not present

## 2016-09-05 DIAGNOSIS — Z79899 Other long term (current) drug therapy: Secondary | ICD-10-CM | POA: Diagnosis not present

## 2016-09-05 DIAGNOSIS — E039 Hypothyroidism, unspecified: Secondary | ICD-10-CM | POA: Diagnosis not present

## 2016-09-25 DIAGNOSIS — M47816 Spondylosis without myelopathy or radiculopathy, lumbar region: Secondary | ICD-10-CM | POA: Diagnosis not present

## 2016-11-08 DIAGNOSIS — R000 Tachycardia, unspecified: Secondary | ICD-10-CM | POA: Diagnosis not present

## 2016-11-08 DIAGNOSIS — R55 Syncope and collapse: Secondary | ICD-10-CM | POA: Diagnosis not present

## 2016-11-14 DIAGNOSIS — R000 Tachycardia, unspecified: Secondary | ICD-10-CM | POA: Diagnosis not present

## 2016-11-20 DIAGNOSIS — I471 Supraventricular tachycardia: Secondary | ICD-10-CM | POA: Diagnosis not present

## 2016-12-11 DIAGNOSIS — M810 Age-related osteoporosis without current pathological fracture: Secondary | ICD-10-CM | POA: Insufficient documentation

## 2017-01-05 DIAGNOSIS — S0990XA Unspecified injury of head, initial encounter: Secondary | ICD-10-CM | POA: Diagnosis not present

## 2017-01-05 DIAGNOSIS — I10 Essential (primary) hypertension: Secondary | ICD-10-CM | POA: Diagnosis not present

## 2017-01-05 DIAGNOSIS — M5136 Other intervertebral disc degeneration, lumbar region: Secondary | ICD-10-CM | POA: Diagnosis not present

## 2017-01-05 DIAGNOSIS — M810 Age-related osteoporosis without current pathological fracture: Secondary | ICD-10-CM | POA: Diagnosis not present

## 2017-01-05 DIAGNOSIS — G9389 Other specified disorders of brain: Secondary | ICD-10-CM | POA: Diagnosis not present

## 2017-01-05 DIAGNOSIS — S3993XA Unspecified injury of pelvis, initial encounter: Secondary | ICD-10-CM | POA: Diagnosis not present

## 2017-01-05 DIAGNOSIS — R4182 Altered mental status, unspecified: Secondary | ICD-10-CM | POA: Diagnosis not present

## 2017-01-05 DIAGNOSIS — S299XXA Unspecified injury of thorax, initial encounter: Secondary | ICD-10-CM | POA: Diagnosis not present

## 2017-01-05 DIAGNOSIS — R9431 Abnormal electrocardiogram [ECG] [EKG]: Secondary | ICD-10-CM | POA: Diagnosis not present

## 2017-01-05 DIAGNOSIS — K449 Diaphragmatic hernia without obstruction or gangrene: Secondary | ICD-10-CM | POA: Diagnosis not present

## 2017-01-05 DIAGNOSIS — M542 Cervicalgia: Secondary | ICD-10-CM | POA: Diagnosis not present

## 2017-01-05 DIAGNOSIS — Z79899 Other long term (current) drug therapy: Secondary | ICD-10-CM | POA: Diagnosis not present

## 2017-01-05 DIAGNOSIS — G8929 Other chronic pain: Secondary | ICD-10-CM | POA: Diagnosis not present

## 2017-01-05 DIAGNOSIS — M5030 Other cervical disc degeneration, unspecified cervical region: Secondary | ICD-10-CM | POA: Diagnosis not present

## 2017-01-05 DIAGNOSIS — R42 Dizziness and giddiness: Secondary | ICD-10-CM | POA: Diagnosis not present

## 2017-01-05 DIAGNOSIS — W19XXXA Unspecified fall, initial encounter: Secondary | ICD-10-CM | POA: Diagnosis not present

## 2017-01-05 DIAGNOSIS — R6889 Other general symptoms and signs: Secondary | ICD-10-CM | POA: Diagnosis not present

## 2017-01-05 DIAGNOSIS — S0101XA Laceration without foreign body of scalp, initial encounter: Secondary | ICD-10-CM | POA: Diagnosis not present

## 2017-01-05 DIAGNOSIS — I4891 Unspecified atrial fibrillation: Secondary | ICD-10-CM | POA: Diagnosis not present

## 2017-01-05 DIAGNOSIS — N390 Urinary tract infection, site not specified: Secondary | ICD-10-CM | POA: Diagnosis not present

## 2017-01-05 DIAGNOSIS — M549 Dorsalgia, unspecified: Secondary | ICD-10-CM | POA: Diagnosis not present

## 2017-01-05 DIAGNOSIS — I471 Supraventricular tachycardia: Secondary | ICD-10-CM | POA: Diagnosis not present

## 2017-01-05 DIAGNOSIS — R41841 Cognitive communication deficit: Secondary | ICD-10-CM | POA: Diagnosis not present

## 2017-01-05 DIAGNOSIS — S060X0A Concussion without loss of consciousness, initial encounter: Secondary | ICD-10-CM | POA: Diagnosis not present

## 2017-01-05 DIAGNOSIS — M8580 Other specified disorders of bone density and structure, unspecified site: Secondary | ICD-10-CM | POA: Diagnosis not present

## 2017-01-05 DIAGNOSIS — I4510 Unspecified right bundle-branch block: Secondary | ICD-10-CM | POA: Diagnosis not present

## 2017-01-05 DIAGNOSIS — Z885 Allergy status to narcotic agent status: Secondary | ICD-10-CM | POA: Diagnosis not present

## 2017-01-05 DIAGNOSIS — E039 Hypothyroidism, unspecified: Secondary | ICD-10-CM | POA: Diagnosis not present

## 2017-01-05 DIAGNOSIS — S199XXA Unspecified injury of neck, initial encounter: Secondary | ICD-10-CM | POA: Diagnosis not present

## 2017-01-05 DIAGNOSIS — Z9181 History of falling: Secondary | ICD-10-CM | POA: Diagnosis not present

## 2017-01-05 DIAGNOSIS — Z8673 Personal history of transient ischemic attack (TIA), and cerebral infarction without residual deficits: Secondary | ICD-10-CM | POA: Diagnosis not present

## 2017-01-05 DIAGNOSIS — Z87891 Personal history of nicotine dependence: Secondary | ICD-10-CM | POA: Diagnosis not present

## 2017-01-06 DIAGNOSIS — R296 Repeated falls: Secondary | ICD-10-CM | POA: Diagnosis not present

## 2017-01-06 DIAGNOSIS — E039 Hypothyroidism, unspecified: Secondary | ICD-10-CM | POA: Diagnosis not present

## 2017-01-06 DIAGNOSIS — R410 Disorientation, unspecified: Secondary | ICD-10-CM | POA: Diagnosis not present

## 2017-01-06 DIAGNOSIS — I10 Essential (primary) hypertension: Secondary | ICD-10-CM | POA: Diagnosis not present

## 2017-01-07 DIAGNOSIS — I10 Essential (primary) hypertension: Secondary | ICD-10-CM | POA: Diagnosis not present

## 2017-01-07 DIAGNOSIS — B9620 Unspecified Escherichia coli [E. coli] as the cause of diseases classified elsewhere: Secondary | ICD-10-CM | POA: Diagnosis not present

## 2017-01-07 DIAGNOSIS — M545 Low back pain: Secondary | ICD-10-CM | POA: Diagnosis not present

## 2017-01-07 DIAGNOSIS — R296 Repeated falls: Secondary | ICD-10-CM | POA: Diagnosis not present

## 2017-01-07 DIAGNOSIS — N390 Urinary tract infection, site not specified: Secondary | ICD-10-CM | POA: Diagnosis not present

## 2017-01-07 DIAGNOSIS — E039 Hypothyroidism, unspecified: Secondary | ICD-10-CM | POA: Diagnosis not present

## 2017-01-08 ENCOUNTER — Encounter
Admission: RE | Admit: 2017-01-08 | Discharge: 2017-01-08 | Disposition: A | Discharge status: Home / Self Care | Payer: PPO | Source: Ambulatory Visit | Attending: Internal Medicine | Admitting: Internal Medicine

## 2017-01-08 DIAGNOSIS — B9620 Unspecified Escherichia coli [E. coli] as the cause of diseases classified elsewhere: Secondary | ICD-10-CM | POA: Diagnosis not present

## 2017-01-08 DIAGNOSIS — I10 Essential (primary) hypertension: Secondary | ICD-10-CM | POA: Diagnosis not present

## 2017-01-08 DIAGNOSIS — R296 Repeated falls: Secondary | ICD-10-CM | POA: Diagnosis not present

## 2017-01-08 DIAGNOSIS — E039 Hypothyroidism, unspecified: Secondary | ICD-10-CM | POA: Diagnosis not present

## 2017-01-08 DIAGNOSIS — M545 Low back pain: Secondary | ICD-10-CM | POA: Diagnosis not present

## 2017-01-08 DIAGNOSIS — R197 Diarrhea, unspecified: Secondary | ICD-10-CM | POA: Diagnosis not present

## 2017-01-08 DIAGNOSIS — N390 Urinary tract infection, site not specified: Secondary | ICD-10-CM | POA: Diagnosis not present

## 2017-01-09 ENCOUNTER — Encounter
Admission: RE | Admit: 2017-01-09 | Discharge: 2017-01-09 | Disposition: A | Discharge status: Home / Self Care | Payer: PPO | Source: Ambulatory Visit | Attending: Internal Medicine | Admitting: Internal Medicine

## 2017-01-09 DIAGNOSIS — R262 Difficulty in walking, not elsewhere classified: Secondary | ICD-10-CM | POA: Diagnosis not present

## 2017-01-09 DIAGNOSIS — G9340 Encephalopathy, unspecified: Secondary | ICD-10-CM | POA: Diagnosis not present

## 2017-01-09 DIAGNOSIS — R7611 Nonspecific reaction to tuberculin skin test without active tuberculosis: Secondary | ICD-10-CM | POA: Diagnosis not present

## 2017-01-09 DIAGNOSIS — I10 Essential (primary) hypertension: Secondary | ICD-10-CM | POA: Diagnosis not present

## 2017-01-09 DIAGNOSIS — K219 Gastro-esophageal reflux disease without esophagitis: Secondary | ICD-10-CM | POA: Diagnosis not present

## 2017-01-09 DIAGNOSIS — Z8673 Personal history of transient ischemic attack (TIA), and cerebral infarction without residual deficits: Secondary | ICD-10-CM | POA: Diagnosis not present

## 2017-01-09 DIAGNOSIS — S0101XD Laceration without foreign body of scalp, subsequent encounter: Secondary | ICD-10-CM | POA: Diagnosis not present

## 2017-01-09 DIAGNOSIS — M6281 Muscle weakness (generalized): Secondary | ICD-10-CM | POA: Diagnosis not present

## 2017-01-09 DIAGNOSIS — R296 Repeated falls: Secondary | ICD-10-CM | POA: Diagnosis not present

## 2017-01-09 DIAGNOSIS — E039 Hypothyroidism, unspecified: Secondary | ICD-10-CM | POA: Diagnosis not present

## 2017-01-09 DIAGNOSIS — F0280 Dementia in other diseases classified elsewhere without behavioral disturbance: Secondary | ICD-10-CM | POA: Diagnosis not present

## 2017-01-09 DIAGNOSIS — E538 Deficiency of other specified B group vitamins: Secondary | ICD-10-CM | POA: Diagnosis not present

## 2017-01-09 DIAGNOSIS — Z9181 History of falling: Secondary | ICD-10-CM | POA: Diagnosis not present

## 2017-01-09 DIAGNOSIS — M810 Age-related osteoporosis without current pathological fracture: Secondary | ICD-10-CM | POA: Diagnosis not present

## 2017-01-09 DIAGNOSIS — I471 Supraventricular tachycardia: Secondary | ICD-10-CM | POA: Diagnosis not present

## 2017-01-09 DIAGNOSIS — B9620 Unspecified Escherichia coli [E. coli] as the cause of diseases classified elsewhere: Secondary | ICD-10-CM | POA: Diagnosis not present

## 2017-01-09 DIAGNOSIS — M5106 Intervertebral disc disorders with myelopathy, lumbar region: Secondary | ICD-10-CM | POA: Diagnosis not present

## 2017-01-09 DIAGNOSIS — R410 Disorientation, unspecified: Secondary | ICD-10-CM | POA: Diagnosis not present

## 2017-01-09 DIAGNOSIS — N3000 Acute cystitis without hematuria: Secondary | ICD-10-CM | POA: Diagnosis not present

## 2017-01-09 DIAGNOSIS — N390 Urinary tract infection, site not specified: Secondary | ICD-10-CM | POA: Diagnosis not present

## 2017-01-09 DIAGNOSIS — R531 Weakness: Secondary | ICD-10-CM | POA: Diagnosis not present

## 2017-01-10 DIAGNOSIS — E039 Hypothyroidism, unspecified: Secondary | ICD-10-CM | POA: Diagnosis not present

## 2017-01-10 DIAGNOSIS — I10 Essential (primary) hypertension: Secondary | ICD-10-CM | POA: Diagnosis not present

## 2017-01-10 DIAGNOSIS — M5106 Intervertebral disc disorders with myelopathy, lumbar region: Secondary | ICD-10-CM | POA: Diagnosis not present

## 2017-01-10 DIAGNOSIS — N3000 Acute cystitis without hematuria: Secondary | ICD-10-CM | POA: Insufficient documentation

## 2017-01-10 DIAGNOSIS — R296 Repeated falls: Secondary | ICD-10-CM | POA: Diagnosis not present

## 2017-01-10 DIAGNOSIS — G9340 Encephalopathy, unspecified: Secondary | ICD-10-CM | POA: Diagnosis not present

## 2017-01-13 ENCOUNTER — Other Ambulatory Visit: Payer: Self-pay | Admitting: *Deleted

## 2017-01-13 NOTE — Patient Outreach (Signed)
[facility](THN) Care Management  01/13/2017  Lucile ShuttersMargaret J Bitter 27-Mar-1927 [id number]  Outgoing telephone call to patient. Patient's son Molly Maduro(Robert) stated, patient is in rehab [facility] in [place] related to a fall.   Plan: RN CM will send [facility] referral for patient in rehab at [facility].  Wynelle ClevelandJuanita Sandor Arboleda, RN, BSN, MHA/MSL, [facility] Telephonic Care Manager Coordinator [facility] Direct Phone: [phone] Toll Free: [phone] Fax: [phone]

## 2017-01-13 NOTE — Addendum Note (Signed)
Addended by: Kerney ElbeFUTRELL, Ryliegh Mcduffey M on: 01/13/2017 10:50 AM   Modules accepted: Orders

## 2017-01-14 ENCOUNTER — Other Ambulatory Visit: Payer: Self-pay | Admitting: *Deleted

## 2017-01-14 NOTE — Patient Outreach (Addendum)
[facility](THN) Care Management  01/14/2017  Lucile ShuttersMargaret J Chandler Jul 16, 1927 [id number]   [facility] Referral received from telephonic RNCM on 01/13/17 to follow patient while at [facility]. Home visit for 01/16/17.    Adriana ReamsChrystal Land, LCSW [facility] Care Management [phone]

## 2017-01-16 ENCOUNTER — Ambulatory Visit: Payer: Self-pay | Admitting: *Deleted

## 2017-01-17 ENCOUNTER — Encounter: Payer: Self-pay | Admitting: *Deleted

## 2017-01-17 ENCOUNTER — Non-Acute Institutional Stay (SKILLED_NURSING_FACILITY): Payer: PPO | Admitting: Gerontology

## 2017-01-17 ENCOUNTER — Other Ambulatory Visit: Payer: Self-pay | Admitting: *Deleted

## 2017-01-17 DIAGNOSIS — N390 Urinary tract infection, site not specified: Secondary | ICD-10-CM | POA: Diagnosis not present

## 2017-01-17 DIAGNOSIS — G9340 Encephalopathy, unspecified: Secondary | ICD-10-CM | POA: Diagnosis not present

## 2017-01-17 DIAGNOSIS — R531 Weakness: Secondary | ICD-10-CM | POA: Diagnosis not present

## 2017-01-17 NOTE — Patient Outreach (Signed)
  [facility](THN) Care Management  [facility] Social Work  01/17/2017  Teresa ShuttersMargaret J Mosley 1927-04-26 [id number]  Subjective:   Patient is a 83 year old female, [facility] following hospitals stay for a fall. Ad[facility] on 01/09/17. Patient has a med alert system but was not wearing it at the time of the fall. PT present, no specific discharge date`set yet. Patient continues to work on  [employer]amina, endurance and activity tolerance. Patient also working on balance. Per patient, she lives with her son who provides some care, however her daughter from [place] will be staying with her until she gets settled. Patient describes a supportive church but they are a older congregation and support is limited., Daughter -Rockwell AlexandriaBecky Mitchell [phone]. Daughter interested in the PACE program for patient as it cam recommended by patient's provider. Program discussed, contact information provided to set up a tour. Objective:   Encounter Medications:  Outpatient Encounter Medications as of 01/17/2017  Medication Sig  . amLODipine (NORVASC) 5 MG tablet Take 5 mg by mouth daily.  . cholecalciferol (VITAMIN D) 1000 units tablet Take 1,000 Units by mouth daily.  Marland Kitchen. HYDROcodone-acetaminophen (NORCO) 5-325 MG tablet Take 1 tablet by mouth every 6 (six) hours as needed for moderate pain.  Marland Kitchen. HYDROcodone-acetaminophen (NORCO/VICODIN) 5-325 MG tablet Take 1 tablet by mouth 3 (three) times daily. For ten days  . levothyroxine (SYNTHROID, LEVOTHROID) 112 MCG tablet Take 112 mcg by mouth daily before breakfast.  . omeprazole (PRILOSEC) 20 MG capsule Take 20 mg by mouth daily.  . sertraline (ZOLOFT) 25 MG tablet Take 25 mg by mouth daily.  . vitamin C (ASCORBIC ACID) 500 MG tablet Take 500 mg by mouth daily.   No facility-administered encounter medications on file as of 01/17/2017.     Functional Status:  No flowsheet data found.  Fall/Depression Screening:  No flowsheet data  found.  Assessment: Patient and daughter very pleasant. Patient motivated to gain her strength and mobility  back. Patient's daughter very supportive and willing to remain in town until patient transitions and is settled at home. Per PT, patient may be looking at approximately 1 more week for rehab.  Plan: This Child psychotherapistsocial worker top continue to follow patient while in rehab.,    Adriana ReamsChrystal Travonta Gill, LCSW [facility] Care Management [phone]

## 2017-01-20 ENCOUNTER — Encounter: Payer: Self-pay | Admitting: *Deleted

## 2017-01-20 ENCOUNTER — Encounter: Payer: Self-pay | Admitting: Gerontology
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
## 2017-01-22 DIAGNOSIS — E039 Hypothyroidism, unspecified: Secondary | ICD-10-CM | POA: Diagnosis not present

## 2017-01-22 DIAGNOSIS — N3000 Acute cystitis without hematuria: Secondary | ICD-10-CM | POA: Diagnosis not present

## 2017-01-22 DIAGNOSIS — M5106 Intervertebral disc disorders with myelopathy, lumbar region: Secondary | ICD-10-CM | POA: Diagnosis not present

## 2017-01-22 DIAGNOSIS — R296 Repeated falls: Secondary | ICD-10-CM | POA: Diagnosis not present

## 2017-01-22 DIAGNOSIS — I10 Essential (primary) hypertension: Secondary | ICD-10-CM | POA: Diagnosis not present

## 2017-01-24 ENCOUNTER — Other Ambulatory Visit: Payer: Self-pay | Admitting: *Deleted

## 2017-01-24 NOTE — Patient Outreach (Signed)
[facility](THN) Care Management  01/24/2017  Teresa Mosley [birth year] [id number]   Phone call to patient's daughter [name], to follow up on patient progress in rehab. Per patient's daughter patient discharged on Monday after losing an appeal for a longer stay. [facility] has [facility] and patient now receiving PT and OT. Patient fell on Tuesday night, her daughter was sleeping and did not hear her, patient's med alert was on the charger.  Patient's daughter has now purchased a baby monitor in order to keep a better eye on patient. Patient has been able to use the monitor to notify her daughter when she needs to go to the bathroom. Per patient's daughter, she has completed a referral for Meals on Wheels and has been placed on the waiting list.  Patient haf some vaginal bleeding, has cleared not follow up appointment  scheduled with a NP on Monday at patient's provider's office. Patient is scheduled to  see a new Geriatric doctor (Dr. Hollace HaywardPlonk) on Tuesday 01/28/17.  Patient to be referred to [facility] for [facility] of care. This social worker to follow up with patient to assess for continued social work needs.   Adriana ReamsChrystal Land, LCSW [facility] Care Management [phone]

## 2017-01-25 DIAGNOSIS — R296 Repeated falls: Secondary | ICD-10-CM | POA: Diagnosis not present

## 2017-01-25 DIAGNOSIS — N390 Urinary tract infection, site not specified: Secondary | ICD-10-CM | POA: Diagnosis not present

## 2017-01-25 DIAGNOSIS — M1990 Unspecified osteoarthritis, unspecified site: Secondary | ICD-10-CM | POA: Diagnosis not present

## 2017-01-25 DIAGNOSIS — I4891 Unspecified atrial fibrillation: Secondary | ICD-10-CM | POA: Diagnosis not present

## 2017-01-25 DIAGNOSIS — G8929 Other chronic pain: Secondary | ICD-10-CM | POA: Diagnosis not present

## 2017-01-25 DIAGNOSIS — R5383 Other fatigue: Secondary | ICD-10-CM | POA: Diagnosis not present

## 2017-01-25 DIAGNOSIS — M549 Dorsalgia, unspecified: Secondary | ICD-10-CM | POA: Diagnosis not present

## 2017-01-25 DIAGNOSIS — I10 Essential (primary) hypertension: Secondary | ICD-10-CM | POA: Diagnosis not present

## 2017-01-25 DIAGNOSIS — S0003XA Contusion of scalp, initial encounter: Secondary | ICD-10-CM | POA: Diagnosis not present

## 2017-01-26 ENCOUNTER — Other Ambulatory Visit: Payer: Self-pay

## 2017-01-26 NOTE — Patient Outreach (Signed)
[facility](THN) Care Management  01/26/2017  Teresa ShuttersMargaret J Mosley December 15, 1927 [id number]   Transition of care  Referral date: 01/1817 Referral source:  [facility] 01[facility]/17 Insurance: Health team advantage. Attempt #1  Telephone call to patient regarding transition of care follow up. HIPAA compliant voice message left with call back phone number.   PLAN: RNCM will attempt 2nd telephone call to patient within 3 business days.   George InaDavina Juliahna Wiswell RN,BSN,CCM [facility] Telephonic  [phone]

## 2017-01-27 DIAGNOSIS — N939 Abnormal uterine and vaginal bleeding, unspecified: Secondary | ICD-10-CM | POA: Diagnosis not present

## 2017-01-27 DIAGNOSIS — N390 Urinary tract infection, site not specified: Secondary | ICD-10-CM | POA: Diagnosis not present

## 2017-01-28 ENCOUNTER — Other Ambulatory Visit: Payer: Self-pay

## 2017-01-28 ENCOUNTER — Other Ambulatory Visit
Admission: RE | Admit: 2017-01-28 | Discharge: 2017-01-28 | Disposition: A | Discharge status: Home / Self Care | Payer: PPO | Source: Ambulatory Visit | Attending: Family Medicine | Admitting: Family Medicine

## 2017-01-28 ENCOUNTER — Ambulatory Visit: Payer: PPO | Admitting: Family Medicine

## 2017-01-28 ENCOUNTER — Encounter: Payer: Self-pay | Admitting: Family Medicine

## 2017-01-28 VITALS — BP 123/78 | HR 87 | Resp 16 | Ht 63.0 in | Wt 121.0 lb

## 2017-01-28 DIAGNOSIS — M5136 Other intervertebral disc degeneration, lumbar region: Secondary | ICD-10-CM

## 2017-01-28 DIAGNOSIS — K219 Gastro-esophageal reflux disease without esophagitis: Secondary | ICD-10-CM

## 2017-01-28 DIAGNOSIS — R350 Frequency of micturition: Secondary | ICD-10-CM | POA: Insufficient documentation

## 2017-01-28 DIAGNOSIS — N390 Urinary tract infection, site not specified: Secondary | ICD-10-CM | POA: Diagnosis not present

## 2017-01-28 DIAGNOSIS — E559 Vitamin D deficiency, unspecified: Secondary | ICD-10-CM | POA: Insufficient documentation

## 2017-01-28 DIAGNOSIS — E039 Hypothyroidism, unspecified: Secondary | ICD-10-CM | POA: Insufficient documentation

## 2017-01-28 DIAGNOSIS — F0150 Vascular dementia without behavioral disturbance: Secondary | ICD-10-CM

## 2017-01-28 DIAGNOSIS — E538 Deficiency of other specified B group vitamins: Secondary | ICD-10-CM | POA: Diagnosis not present

## 2017-01-28 DIAGNOSIS — I10 Essential (primary) hypertension: Secondary | ICD-10-CM | POA: Diagnosis not present

## 2017-01-28 DIAGNOSIS — R630 Anorexia: Secondary | ICD-10-CM | POA: Diagnosis not present

## 2017-01-28 LAB — COMPREHENSIVE METABOLIC PANEL
ALK PHOS: 55 U/L (ref 38–126)
ALT: 16 U/L (ref 14–54)
AST: 25 U/L (ref 15–41)
Albumin: 4 g/dL (ref 3.5–5.0)
Anion gap: 7 (ref 5–15)
BILIRUBIN TOTAL: 0.6 mg/dL (ref 0.3–1.2)
BUN: 30 mg/dL — ABNORMAL HIGH (ref 6–20)
CALCIUM: 9.5 mg/dL (ref 8.9–10.3)
CO2: 26 mmol/L (ref 22–32)
CREATININE: 0.75 mg/dL (ref 0.44–1.00)
Chloride: 104 mmol/L (ref 101–111)
GFR calc non Af Amer: >60 mL/min (ref >60)
GLUCOSE: 98 mg/dL (ref 65–99)
Potassium: 4.3 mmol/L (ref 3.5–5.1)
SODIUM: 137 mmol/L (ref 135–145)
TOTAL PROTEIN: 7.3 g/dL (ref 6.5–8.1)

## 2017-01-28 LAB — TSH: TSH: 0.352 u[IU]/mL (ref 0.350–4.500)

## 2017-01-28 LAB — LIPID PANEL
Cholesterol: 196 mg/dL (ref 0–200)
HDL: 61 mg/dL (ref >40)
LDL CALC: 117 mg/dL — AB (ref 0–99)
Total CHOL/HDL Ratio: 3.2 RATIO
Triglycerides: 89 mg/dL (ref <150)
VLDL: 18 mg/dL (ref 0–40)

## 2017-01-28 LAB — CBC
HCT: 39.5 % (ref 35.0–47.0)
Hemoglobin: 13.1 g/dL (ref 12.0–16.0)
MCH: 31.1 pg (ref 26.0–34.0)
MCHC: 33.2 g/dL (ref 32.0–36.0)
MCV: 93.6 fL (ref 80.0–100.0)
PLATELETS: 145 10*3/uL — AB (ref 150–440)
RBC: 4.23 MIL/uL (ref 3.80–5.20)
RDW: 14.1 % (ref 11.5–14.5)
WBC: 6.7 10*3/uL (ref 3.6–11.0)

## 2017-01-28 LAB — VITAMIN B12: Vitamin B-12: 2200 pg/mL — ABNORMAL HIGH (ref 180–914)

## 2017-01-28 MED ORDER — MIRTAZAPINE 15 MG PO TABS: 15.0000 mg | ORAL_TABLET | Freq: Every day | ORAL | 2 refills | Status: DC

## 2017-01-28 NOTE — Patient Outreach (Signed)
[facility](THN) Care Management  01/28/2017  Lucile ShuttersMargaret J Maniaci 02-Dec-1927 [id number]  Transition of care  Referral date: 01/1817 Referral source:  [facility] 01/[facility]/17 Insurance: Health team advantage. Attempt #2  Telephone call to patient for transition of care follow up. Unable to reach patient. HIPAA compliant voice message left with call back phone number.  Telephone call to patients listed emergency contact /daughter, Rockwell AlexandriaBecky Mitchell.  Contact answering phone states she was patients daughter.  RNCM requested to speak with patient. Informed contact would need to have verbal authorization from patient to speak with her. Contact states this was not a good time to talk. Patient currently in the doctors office. Requested call back at a later day. HIPAA compliant message left with call back phone number.   PLAN; RNCM will attempt 3rd telephone call to patient within 3 business days.   George InaDavina Kareli Hossain RN,BSN,CCM [facility] Telephonic  [phone]

## 2017-01-28 NOTE — Patient Instructions (Signed)
Change Zoloft to Remeron 15 mg daily at bedtime. Change Aleve to Tylenol 1000 mg twice daily. Taper off Prilosec. Take one tablet every other day for two weeks then every third day for two weeks then stop.

## 2017-01-29 ENCOUNTER — Other Ambulatory Visit: Payer: Self-pay

## 2017-01-29 DIAGNOSIS — K219 Gastro-esophageal reflux disease without esophagitis: Secondary | ICD-10-CM | POA: Insufficient documentation

## 2017-01-29 DIAGNOSIS — R630 Anorexia: Secondary | ICD-10-CM | POA: Insufficient documentation

## 2017-01-29 DIAGNOSIS — N390 Urinary tract infection, site not specified: Secondary | ICD-10-CM | POA: Insufficient documentation

## 2017-01-29 DIAGNOSIS — M5136 Other intervertebral disc degeneration, lumbar region: Secondary | ICD-10-CM | POA: Insufficient documentation

## 2017-01-29 DIAGNOSIS — F0150 Vascular dementia without behavioral disturbance: Secondary | ICD-10-CM | POA: Insufficient documentation

## 2017-01-29 LAB — VITAMIN D 25 HYDROXY (VIT D DEFICIENCY, FRACTURES): VIT D 25 HYDROXY: 29.2 ng/mL — AB (ref 30.0–100.0)

## 2017-01-29 MED ORDER — ACETAMINOPHEN 500 MG PO TABS: 1000.0000 mg | ORAL_TABLET | Freq: Two times a day (BID) | ORAL | 0 refills | Status: AC

## 2017-01-29 NOTE — Patient Outreach (Signed)
[facility]N) Care Management  01/29/2017  Teresa Mosley August 31, 1927 [id number]   Case closure Referral date:01/1817 Referral source:[facility]charged [facility] [place] on 01/20/17 Insurance:Health team advantage.     Patient has been contacted by Toll BrothersChrystal Land social worker with [facility] who will refer her to [facility] community case manager.   PLAN: No further follow up needed by [facility] at this time.   George InaDavina Skilynn Durney RN,BSN,CCM [facility] Telephonic  [phone]

## 2017-01-29 NOTE — Progress Notes (Signed)
Date:  01/28/2017   Name:  Teresa Mosley   DOB:  14-Jun-1927   MRN:  [id number]  PCP:  Schuyler AmorPlonk, Brecken Walth, MD    Chief Complaint: Advice Only (Unsure about if she wants to stay in area for PCP. ); Fall ([facility] in care everywhere ); Back Pain (Volteran Gel or tab used in Rehab would like that again ); and Urinary Frequency (Hx UTI feeling like she may have one with the dizzy spells and the frequency )   History of Present Illness:  This is a 83 y.o. female seen for initial visit, unsure she will stay in area. Larey SeatFell last month, [facility]s stopped most meds but restarted in rehab. On Zoloft but denies depression, appetite poor. On Prilosec for years, has not tried taper. Takes Aleve for OA pain. Synthroid recently decreased to 100 mcg daily. Hx comp fx year ago, on vit D supp. B12 def on supp. HTN on metoprolol/losartan. MRI brain in March showed multiple old infarcts. MRI lumbar spine in April showed DDD.  Review of Systems:  Review of Systems  Constitutional: Negative for chills and fever.  HENT: Negative for ear pain, sinus pain and sore throat.   Eyes: Negative for pain.  Respiratory: Negative for cough and shortness of breath.   Cardiovascular: Negative for chest pain and leg swelling.  Gastrointestinal: Negative for abdominal pain.  Genitourinary: Negative for difficulty urinating.  Neurological: Negative for syncope and light-headedness.    Patient Active Problem List   Diagnosis Date Noted  . Vascular dementia 01/29/2017  . Hypothyroidism 01/28/2017  . Vitamin D deficiency 01/28/2017  . Hypertension 01/28/2017  . Acute cystitis without hematuria 01/10/2017  . Age-related osteoporosis without current pathological fracture 12/11/2016  . B12 deficiency 09/05/2016  . Closed compression fracture of thoracic vertebra (HCC) 03/08/2016  . Hyperlipidemia, mixed 08/29/2015  . Aortic insufficiency 06/27/2013  . Lumbar disc disorder with myelopathy 06/27/2013     Prior to Admission medications   Medication Sig Start Date End Date Taking? Authorizing Provider  amLODipine (NORVASC) 5 MG tablet Take 5 mg daily by mouth.    Yes [provider]  cholecalciferol (VITAMIN D) 1000 units tablet Take 1,000 Units by mouth daily.   Yes [provider]  cyanocobalamin 500 MCG tablet Take 500 mcg by mouth daily.   Yes [provider]  levothyroxine (SYNTHROID, LEVOTHROID) 112 MCG tablet Take 100 mcg by mouth daily before breakfast.   Yes [provider]  losartan (COZAAR) 50 MG tablet Take 50 mg daily by mouth.   Yes [provider]  metoprolol succinate (TOPROL-XL) 25 MG 24 hr tablet Take 25 mg daily by mouth.   Yes [provider]  vitamin C (ASCORBIC ACID) 500 MG tablet Take 500 mg by mouth daily.   Yes [provider]  mirtazapine (REMERON) 15 MG tablet Take 1 tablet (15 mg total) by mouth at bedtime. 01/28/17   Schuyler AmorPlonk, Makaveli Hoard, MD    Allergies  Allergen Reactions  . Duloxetine Shortness Of Breath  . Ace Inhibitors Cough  . Codeine Nausea And Vomiting  . Shellfish Allergy Nausea And Vomiting  . Alendronate Nausea Only    Past Surgical History:  Procedure Laterality Date  . ABDOMINAL HYSTERECTOMY  1975  . APPENDECTOMY  1956  . EXCISION MORTON'S NEUROMA Bilateral   . EYE SURGERY Bilateral    Cataract Extraction with IOL  . KYPHOPLASTY N/A 01/16/2016   Procedure: KYPHOPLASTY T11;  Surgeon: Kennedy BuckerMichael Menz, MD;  Location:  [facility] ORS;  Service: Orthopedics;  Laterality: N/A;    Social History   Tobacco Use  . Smoking status: Former Games developermoker  . Smokeless tobacco: Never Used  Substance Use Topics  . Alcohol use: No    Frequency: Never    Comment: occassional wine  . Drug use: No    Family History  Problem Relation Age of Onset  . Hypertension Mother   . Heart attack Father   . Stroke Father   . COPD Brother     Medication list has been reviewed and updated.  Physical  Examination: BP 123/78   Pulse 87   Resp 16   Ht 5\' 3"  (1.6 m)   Wt 121 lb (54.9 kg)   SpO2 97%   BMI 21.43 kg/m   Physical Exam  Constitutional: She is oriented to person, place, and time. She appears well-developed and well-nourished.  HENT:  Right Ear: External ear normal.  Left Ear: External ear normal.  Nose: Nose normal.  Mouth/Throat: Oropharynx is clear and moist.  TMs clear  Eyes: Conjunctivae and EOM are normal. Pupils are equal, round, and reactive to light.  Neck: Neck supple. No thyromegaly present.  Cardiovascular: Normal rate, regular rhythm and normal heart sounds.  Pulmonary/Chest: Effort normal and breath sounds normal.  Abdominal: Soft. She exhibits no distension and no mass. There is no tenderness.  Musculoskeletal: She exhibits no edema.  Lymphadenopathy:    She has no cervical adenopathy.  Neurological: She is alert and oriented to person, place, and time. Coordination normal.  Skin: Skin is warm and dry.  Psychiatric: She has a normal mood and affect. Her behavior is normal.  Nursing note and vitals reviewed.   Assessment and Plan:  1. Essential hypertension Well controlled on metoprolol/losartan/amlodipine, consider d/c amlodipine - Comprehensive Metabolic Panel (CMET) - CBC - Lipid Profile  2. Recurrent UTI Unclear benefit vit C, consider d/c  3. Urinary frequency UA negative - POCT urinalysis dipstick  4. Hypothyroidism, unspecified type On Synthroid - TSH  5. Vascular dementia without behavioral disturbance Plan assessment next visit  6. Poor appetite Change Zoloft to Remeron  7. Gastroesophageal reflux disease, esophagitis presence not specified PPI taper as tolerated  8. DDD (degenerative disc disease), lumbar Change Aleve to Tylenol bid  9. B12 deficiency On supplement - B12  10. Vitamin D deficiency On supplement - Vitamin D (25 hydroxy)  Return in about 4 weeks (around 02/25/2017).   One hour spent with pt over  half in counseling  Keilyn Nadal M. Kingsley SpittlePlonk, Jr. MD [facility]  01/29/2017

## 2017-01-31 NOTE — Addendum Note (Signed)
Addended by: Wenda OverlandLAND, CHRYSTAL M on: 01/31/2017 10:42 AM   Modules accepted: Orders

## 2017-02-03 ENCOUNTER — Encounter: Payer: Self-pay | Admitting: *Deleted

## 2017-02-03 ENCOUNTER — Other Ambulatory Visit: Payer: Self-pay | Admitting: *Deleted

## 2017-02-03 LAB — POCT URINALYSIS DIPSTICK
Bilirubin, UA: NEGATIVE
GLUCOSE UA: NEGATIVE
Ketones, UA: NEGATIVE
Leukocytes, UA: NEGATIVE
NITRITE UA: NEGATIVE
Protein, UA: NEGATIVE
RBC UA: NEGATIVE
Spec Grav, UA: 1.01 (ref 1.010–1.025)
Urobilinogen, UA: 0.2 E.U./dL
pH, UA: 7 (ref 5.0–8.0)

## 2017-02-03 NOTE — Patient Outreach (Addendum)
Te[facility]) Care Management [place] Telephone Outreach, Transition of Care day 1  02/03/2017  [place] 10/26/1927 [id number]  Successful telephone outreach to "Becky" daughter/ caregiver for Deshanda Molitor, 83 y/o female referred to [facility] CM due to multiple falls resulting in several ED visits and recent SNF visit for rehabilitation; patient was discharged from SNF visit 01/20/17 and was previously followed by [facility] RN telephonic CM; patient was referred to [place] RN CM 01/31/17 for transition of care after SNF discharge.  Patient has history including, but not limited to, essential HTN, GERD, recurrent UTI, multiple recent falls, DDD with osteoporosis, chronic back/ leg pain, and aortic insufficiency.  Patient provided verbal permission to [facility] CSW on 01/17/17 to speak with her daughter Jacqlyn Larsen; HIPAA/ identity verified during phone call with patient's daughter today.  [place] services were discussed with patient's daughter, and verbal permission for [facility] RN CCM involvement in patient's care was provided.  Explained to caregiver that I was contacting patient for primary [facility] RN CM Kalman Shan Pierzchala).  Today, patient's caregiver reports that patient "seems better today," and she denies that patient is in pain.  Daughter reports that patient "takes a nap between 12-4 every day," and reports that she is unable to speak to me today, as she is napping.  Caregiver denies that patient is in pain/ distress today.  Caregiver further reports:  Medications: -- Has all medicationsand takes as prescribed;denies questions about current medications.  -- Verbalizes good general understanding of the purpose, dosing, and scheduling of medications.   --  Family manages medications for patient using weekly pill planner box, but daughter reports today that patient's pharmacy will be filling pill boxes for patient for one month at a time; reports this is to start  "soon." -- denies issues with swallowing medications -- patient was recently discharged from SNF and all medications were thoroughly reviewed with patient's caregiver today during phone call  Home health [facility]) services: -- [facility] services for PT OT, in place through [place]; caregiver reports [facility] services have bee well-established/ in progress since patient's SNF discharge on 01/20/17; reports patient was visited by [facility] PT earlier today, and that [facility] PT/OT sessions are currently in place three times per week; unsure how long [facility] services have been ordered. -- confirms that she and patient have phone numbers for [facility] agency -- confirms patient is actively participating in home health PT/ OT as ordered post-SNF discharge  Provider appointments: -- Attended recent PCP appointment as new patient with Dr. Vicente Masson on 01/28/17; plans are for patient to return "in 3-6 months" for follow up office visit  Safety/ Mobility/ Falls: -- denies new/ recent falls since last fall on 01/25/17, which required ED visit with patient discharge home -- assistive devices: caregiver reports patient "finally" using walker regularly with ambulation; reports that patient did not want to use assistive devices, but has finally agreed to; reports patient cannot use walker in bathroom, due to layout of home, "it won't fit in bathroom."   -- general fall risks/ prevention education discussed with patient today  Social/ Community Resource needs: -- [facility] CSW currently active in patient's care -- reports supportive family members that assist with care needs as indicated; caregiver/ daughter Jacqlyn Larsen lives in [place] state and reports she will be going back to [place] "soon;" reports that patient lives with her son Herbie Baltimore, who is primary caregiver, and is a Chief Technology Officer; reports that although she will be returning to [place]  soon, that she will remain primary caregiver for patient; reports she collaborates with patient's son (her brother) to  ensure patient care needs are met. -- reports that patient has "another son" who does NOT live with patient, "that sometimes causes" patient to experience "a lot of anxiety," as this son has severe bipolar disorder; Jacqlyn Larsen reports that this son's visits to patient are limited to "20 minutes at a time," and are pre-arranged by either [place] or the son that lives with patient.  Jacqlyn Larsen reports that patient occasionally "hyperventilates" when she becomes anxious aorund her bipolar son's visits, or other anxiety producing situations.  -- reports family provides transportation for patient to all provider appointments, errands, etc  Self-health management of frequent falls: -- caregiver reports that she has modified patient's home furniture to allow passage of walker through all rooms except for bathroom; bathroom too small for walker to fit through; states patient "holds on to walls" when using bathroom; considering placing grab bars, but has not wanted to use suction cup grab bars, and currently declines placing bolted grab bars in bathroom, as "the walls are tile," and patient "will not agree" to having permanent grab bars placed. -- daughter/ caregiver has purchased/ is using baby monitor from patient's bedroom to alert her when patient gets up -- patient now using walker regularly; had previously been unwilling to use walker -- general fall risks/ prevention education discussed with patient's caregiver today  Patient's caregiver denies further issues, concerns, or problems today.  I provided caregiver with my direct phone number, the main [facility] CM office phone number, and the [facility] CM 24-hour nurse advice phone number should issues arise prior to next scheduled [place] outreach.  I explained that Kalman Shan) would contact patient/ caregiver again next week for ongoing transition of care; patient's caregiver verbalized understanding and agreement with this plan, and requested that she [facility]) be contacted prior  to patient as patient "will not answer her phone if she doesn't know who is calling."  Becky requests that she be contacted at [phone].  Plan:  Will update patient's primary [facility] RN CM of today's successful telephone outreach to patient for initiation of transition of care  Will make patient's PCP aware of [place] RN CM involvement in patient's care/ send barrier letter  [facility] CM Care Plan Problem One     Most Recent Value  Care Plan Problem One  Risk for hospital readmission related to multiple recent ED visits for falls and recent SNF discharge  Role Documenting the Problem One  Care Management [place] for Problem One  Active  [facility] Long Term Goal   Over the next 31 days, patient will not experience hospital readmission as evidenced by review of EMR and patient/ caregiver reporting during [place] outreach  [place] Term Goal Start Date  02/03/17  Interventions for Problem One Long Term Goal  Discussed with patient's daughter/ caregiver current patient clinical status, recent falls, use of assistive devices for ambulation,  discussed [facility] RN CCM program, TOC program initiated      Oneta Rack, RN, BSN, [employer] [facility] Care Management  [phone]

## 2017-02-04 ENCOUNTER — Encounter: Payer: Self-pay | Admitting: *Deleted

## 2017-02-04 DIAGNOSIS — I10 Essential (primary) hypertension: Secondary | ICD-10-CM | POA: Diagnosis not present

## 2017-02-04 DIAGNOSIS — N3000 Acute cystitis without hematuria: Secondary | ICD-10-CM | POA: Diagnosis not present

## 2017-02-04 DIAGNOSIS — E039 Hypothyroidism, unspecified: Secondary | ICD-10-CM | POA: Diagnosis not present

## 2017-02-04 DIAGNOSIS — R296 Repeated falls: Secondary | ICD-10-CM | POA: Diagnosis not present

## 2017-02-04 DIAGNOSIS — M5106 Intervertebral disc disorders with myelopathy, lumbar region: Secondary | ICD-10-CM | POA: Diagnosis not present

## 2017-02-05 ENCOUNTER — Telehealth: Payer: Self-pay

## 2017-02-05 DIAGNOSIS — N3000 Acute cystitis without hematuria: Secondary | ICD-10-CM | POA: Diagnosis not present

## 2017-02-05 DIAGNOSIS — R296 Repeated falls: Secondary | ICD-10-CM | POA: Diagnosis not present

## 2017-02-05 DIAGNOSIS — I10 Essential (primary) hypertension: Secondary | ICD-10-CM | POA: Diagnosis not present

## 2017-02-05 DIAGNOSIS — M5106 Intervertebral disc disorders with myelopathy, lumbar region: Secondary | ICD-10-CM | POA: Diagnosis not present

## 2017-02-05 DIAGNOSIS — E039 Hypothyroidism, unspecified: Secondary | ICD-10-CM | POA: Diagnosis not present

## 2017-02-05 NOTE — Telephone Encounter (Signed)
Patient is sleeping way to much on the Remeron. She was awake an hour and asleep for 6 then awake 2 hours and asleep for 5. Different for her to be this sleepy and be knocked out. Can we do something different? Family all concerned.

## 2017-02-05 NOTE — Telephone Encounter (Signed)
Recommend decrease Remeron to 7.5 mg qhs.

## 2017-02-05 NOTE — Telephone Encounter (Signed)
Son of patient or brother answered upon me calling patient after I received message from MathenyBecky that patient is sleeping way to much. I told relatives that they are not listed in the chart and I will have to talk to patient. When I called at 4:38 I advised family to wake her up so I can go over any changes after reporting to doctor what they are saying that Remeron is causing her to sleep a lot. I called back to discuss options to change meds and the same gentleman answered and I asked is she awake and he said no. I said to please have her call office when she does wake up so I can go over recommended changes that are important. When I asked him earlier to wake her up he said to me that it is 20 till 5:00 pm. I explained that I can make calls after 5 pm and he seemed to be upset that I would not share medical advise or information and asked do other people get passed out on this med and if so why would a doctor write for it. I said to him I can not share info or give advise but that all humans handle meds differently. He agreed to have her call and sounded very unhappy though he did say he will have her call tomorrow. I advised that if he is truly unable to wake her up to a speaking state at least then he needs to call 911 or get to an ER.

## 2017-02-06 ENCOUNTER — Other Ambulatory Visit: Payer: Self-pay | Admitting: *Deleted

## 2017-02-06 NOTE — Patient Outreach (Signed)
[facility]) Care Management  02/06/2017  Lucile ShuttersMargaret J Devargas Aug 05, 1927 [id number]   Phone call to patient's daughter, [name]  to assess for community resource needs. Patient's daughter not available, voicemail message left requesting return call.  This social worker to contact patient again within 10 days if there is no return call.   Adriana ReamsChrystal Erin Uecker, LCSW [facility] Care Management [phone]

## 2017-02-06 NOTE — Telephone Encounter (Signed)
Becky (Emergency Contact) called and said that patient did take Remeron for 2 days and then stopped taking it. I asked can I speak to patient because it offers more info for me and doctor if I can get her side. Patient reports taking the med for 2 days and both days sleeping from 1:00 pm - 7:00 pm. I asked how does she sleep at night.Marland Kitchen.Marland Kitchen.Marland Kitchen.Reports sleeping 9:00 pm - 8:00 am. I also asked if she has been depressed and she said yes. Appetite is low but no changes. Patient said she does get confused when she wakes up.  Daughter wants to know if we can STOP Remeron but patient does say her legs hurt during the day. Daughter is afraid she will not do PT/OT if she is on this med and sleeping all day and night. Patient stopped the meds on day 3 and has not taken but family thought that meant she was doing better on it until they saw she had not taken it last few days. She only sleeps all day when she takes it. They feel she needs off of it while doing PT and said they hope it lasts months even years. I explained insurance only covers PT for so long. She asked that we look into stopping it so I asked patient is she having any pain and again she said yes since stopping the med she is having leg pain during the days. I will call them before Lunch./

## 2017-02-06 NOTE — Telephone Encounter (Signed)
I'm ok with stopping Remeron. I started it to help with depression and poor appetite but we can see how she does off of it. Other option is decreasing to 7.5 mg qhs which should be less sedating. If nocturnal leg pain more of an issue, can try low dose gabapentin 100 mg qhs. Let me know what family wants to do.

## 2017-02-06 NOTE — Telephone Encounter (Signed)
Advised 

## 2017-02-07 ENCOUNTER — Other Ambulatory Visit: Payer: Self-pay | Admitting: *Deleted

## 2017-02-07 NOTE — Patient Outreach (Deleted)
[facility]) Care Management  02/07/2017  Teresa Mosley 08-Jun-1927 [id number]

## 2017-02-07 NOTE — Patient Outreach (Addendum)
[facility]) Care Management  02/07/2017  Teresa Mosley September 15, 1927 [id number]   Follow up phone call to patient's daughter to assess for social work needs. Per patient's daughter, [name] she will be returning to [place] soon and is concerned about patient being able to complete her ADL's due to recent hospitalization and rehab stay. Patient's son lives with her, however patient [place] considered [place] [facility] [place]" and he is very anxious about being responsible for her care.  Per [name], patient continues to receive HH PT and OT. Patient is walking with her walker and she has re-arranged the furniture in order for patient to get around without incident,  She is also on the waiting list for Meals on Wheels.  After reviewing the eligibility requirements for PACE, it is found that she would not be able to afford the co-pay for this  program.  Patient's daughter discussed being very satisfied with Dr. Hollace HaywardPlonk "he is very thorough" as she has discussed some concerns with patient's medication and he is working with her to make necessary changes. Patient's daughter interested in the congregate meal program through the [facility] once patient is strong enough physically  to attend. Contact information provided for the Nutrition coordinator [phone] in order to make the referral when ready. Referral discussed to the 30 day program through Homecare providers to assist patient with in home assistance as well as a long term plan of care as patient's daughter will plan  to return home to [place] soon.   Plan: This Child psychotherapistsocial worker will follow up with patient within 2 weeks regarding referral to [facility].   Adriana ReamsChrystal Stepheni Cameron, LCSW [facility] Care Management [phone]

## 2017-02-11 DIAGNOSIS — N3000 Acute cystitis without hematuria: Secondary | ICD-10-CM | POA: Diagnosis not present

## 2017-02-11 DIAGNOSIS — E039 Hypothyroidism, unspecified: Secondary | ICD-10-CM | POA: Diagnosis not present

## 2017-02-11 DIAGNOSIS — I10 Essential (primary) hypertension: Secondary | ICD-10-CM | POA: Diagnosis not present

## 2017-02-11 DIAGNOSIS — R296 Repeated falls: Secondary | ICD-10-CM | POA: Diagnosis not present

## 2017-02-11 DIAGNOSIS — M5106 Intervertebral disc disorders with myelopathy, lumbar region: Secondary | ICD-10-CM | POA: Diagnosis not present

## 2017-02-12 ENCOUNTER — Other Ambulatory Visit: Payer: Self-pay | Admitting: *Deleted

## 2017-02-12 NOTE — Patient Outreach (Signed)
Successful Telephone outreach  to Devon EnergyBecky (Environmental managerdaughter/main contact/caregiver) for RadioSh[employer]Margaret Mosley, 83 year old female - [facility] CM 01/31/17 for transition of care.   Hx of multiple falls resulting in several ED visits and recent SNF visit for rehab, discharged from SNF to home 01/20/17.   Pt's history includes but not limited to GERD, recurrent UTI, chronic back/leg pain, aortic insufficiency, Essential HTN.   Daughter [name] provided HIPAA identifiers on pt.  Daughter reports pt is improving very very slowly, very weak when [facility] not there, not moving as fast as in rehab, is dressing herself.  Daughter reports pt deals with arthritic pain in back,not trying as hard as in the past.   Daughter reports pt had 2 falls since discharge home, got pt a raised toilet seat which is helping.   Daughter reports pt's voice is faint, HH speech therapy working with her.  Daughter reports prepares pt's medications, pt taking them all, has it set up at the pharmacy now when time for refill, to fill pt's pill boxes for a month.   Daughter reports she was planning to go back to [place] but going to wait until [facility] is set up/ was contacted by them.   Daughter reports on pt's recent follow up visit with PCP, told UTI resolved.    RN CM discussed with daughter plan to do a home visit with pt, part of transition of care to which she agreed.   Plan:  As discussed with daughter, plan to follow up with pt next week- initial home visit.    Shayne Alkenose M.   Pierzchala RN CCM [facility] Care Management  [phone]

## 2017-02-13 DIAGNOSIS — R296 Repeated falls: Secondary | ICD-10-CM | POA: Diagnosis not present

## 2017-02-13 DIAGNOSIS — M5106 Intervertebral disc disorders with myelopathy, lumbar region: Secondary | ICD-10-CM | POA: Diagnosis not present

## 2017-02-13 DIAGNOSIS — N3000 Acute cystitis without hematuria: Secondary | ICD-10-CM | POA: Diagnosis not present

## 2017-02-13 DIAGNOSIS — E039 Hypothyroidism, unspecified: Secondary | ICD-10-CM | POA: Diagnosis not present

## 2017-02-13 DIAGNOSIS — I10 Essential (primary) hypertension: Secondary | ICD-10-CM | POA: Diagnosis not present

## 2017-02-14 DIAGNOSIS — I10 Essential (primary) hypertension: Secondary | ICD-10-CM | POA: Diagnosis not present

## 2017-02-14 DIAGNOSIS — R296 Repeated falls: Secondary | ICD-10-CM | POA: Diagnosis not present

## 2017-02-14 DIAGNOSIS — E039 Hypothyroidism, unspecified: Secondary | ICD-10-CM | POA: Diagnosis not present

## 2017-02-14 DIAGNOSIS — N3000 Acute cystitis without hematuria: Secondary | ICD-10-CM | POA: Diagnosis not present

## 2017-02-14 DIAGNOSIS — M5106 Intervertebral disc disorders with myelopathy, lumbar region: Secondary | ICD-10-CM | POA: Diagnosis not present

## 2017-02-17 ENCOUNTER — Other Ambulatory Visit: Payer: Self-pay | Admitting: *Deleted

## 2017-02-17 NOTE — Patient Outreach (Signed)
[facility]) Care Management  02/17/2017  Teresa Mosley 27-Oct-1927 [id number]   Phone call to patient's daughter Teresa Mosley to confirm contact from nurse form [facility]. Per Teresa Mosley, Teresa [name] RN from [facility] is scheduled to complete the assessment on 02/18/17 at 11:00am to begin the 30 day reduction of care for patient.   Plan: This social worker will follow up with patient's daughter within 1 week regarding progress of personal care services and care plan following the 30 day reduction of care.   Teresa ReamsChrystal Iyannah Blake, LCSW [facility] Care Management [phone]

## 2017-02-19 ENCOUNTER — Other Ambulatory Visit: Payer: Self-pay | Admitting: *Deleted

## 2017-02-19 ENCOUNTER — Telehealth: Payer: Self-pay | Admitting: Family Medicine

## 2017-02-19 ENCOUNTER — Encounter: Payer: Self-pay | Admitting: *Deleted

## 2017-02-19 NOTE — Patient Outreach (Signed)
[facility]) Care Management   02/19/2017  Teresa Mosley [birth year] [id number]  Teresa Mosley is an 83 y.o. female  Subjective:  Pt reports no recent falls, doing exercises HH PT provided.  Daughter reports  Pt not been feeling good past couple days, effecting appetite, no signs/symptoms of UTI, pt  Taken off Remeron, to see PCP 12/14.  Daughter reports Home Care Providers called, To start coming 5 days a week/2 hours a day which will help as she plans to go back home.   Objective:   Vitals:   02/19/17 1547  BP: 130/70  Pulse: 71  Resp: 16  SpO2: 96%    ROS  Physical Exam  Constitutional: She is oriented to person, place, and time. She appears well-developed and well-nourished.  Cardiovascular: Normal rate, regular rhythm and normal heart sounds.  Respiratory: Effort normal and breath sounds normal.  GI: Soft.  Musculoskeletal: Normal range of motion. She exhibits no edema.  Pt uses walker when ambulating   Neurological: She is alert and oriented to person, place, and time.  Skin: Skin is warm and dry.  Psychiatric: She has a normal mood and affect. Her behavior is normal. Judgment and thought content normal.    Encounter Medications:   Outpatient Encounter Medications as of 02/19/2017  Medication Sig Note  . acetaminophen (TYLENOL) 500 MG tablet Take 2 tablets (1,000 mg total) by mouth 2 (two) times daily.   Marland Kitchen. amLODipine (NORVASC) 5 MG tablet Take 5 mg by mouth daily. 02/03/2017: Daughter reports that this was recently started/ prescribed by "Dr. Hyacinth MeekerMiller"   . Cholecalciferol (VITAMIN D3) 2000 units capsule Take 2,000 Units by mouth daily.   Marland Kitchen. levothyroxine (SYNTHROID, LEVOTHROID) 100 MCG tablet Take 100 mcg by mouth daily before breakfast.   . losartan (COZAAR) 50 MG tablet Take 50 mg daily by mouth.   . metoprolol succinate (TOPROL-XL) 25 MG 24 hr tablet Take 25 mg daily by mouth.   . mirtazapine (REMERON) 15 MG tablet Take 1 tablet (15 mg  total) by mouth at bedtime.    No facility-administered encounter medications on file as of 02/19/2017.     Functional Status:   In your present state of health, do you have any difficulty performing the following activities: 02/19/2017 01/20/2017  Hearing? N N  Vision? N N  Difficulty concentrating or making decisions? Y N  Walking or climbing stairs? N Y  Dressing or bathing? N N  Doing errands, shopping? Malvin JohnsY Y  Preparing Food and eating ? N Y  Using the Toilet? N N  In the past six months, have you accidently leaked urine? N N  Do you have problems with loss of bowel control? N N  Managing your Medications? Y Y  Managing your Finances? Malvin JohnsY Y  Housekeeping or managing your Housekeeping? Y -  Some recent data might be hidden    Fall/Depression Screening:    Fall Risk  02/03/2017 01/28/2017 01/17/2017  Falls in the past year? Yes Yes Yes  Number falls in past yr: 2 or more 2 or more 1  Injury with Fall? Yes - Yes  Risk Factor Category  High Fall Risk High Fall Risk -  Risk for fall due to : History of fall(s);Impaired balance/gait;Impaired mobility - Impaired balance/gait;Impaired mobility  Follow up Falls prevention discussed;Education provided - Education provided   [facility] 01/28/2017 01/17/2017  PHQ - 2 Score 0 0    Assessment:  Pleasant 83 year old female, resides with son.  Daughter [name] (lives out of town) Present during home visit.  This RN CM following pt for transition of care/recent SNF discharge  01/20/17, admitted after multiple falls/several ED visits.  Pt's history includes but not limited to  Hypertension, Hyperlipidemia, Hypothyroidism, Vascular Dementia, GERD, Degenerative disc Disease.     Fall risk:  Had pt ambulate with walker, no observed or reported sob, dizziness.  Reinforced use     Of walker at all times, [facility].  Vital signs stable.   Nutrition:  Per daughter pt's  appetite down, discussed with pt/daughter importance of  nutrition/    Protein with meals.   Per daughter, pt tried small frequent meals/did not work.    Plan:  As discussed with pt/daughter RN CM to continue to follow for transition of care, follow up     Again next week telephonically.            Plan to send Dr. Hollace HaywardPlonk 02/19/17 initial home visit encounter.            [facility] CM Care Plan Problem One     Most Recent Value  Care Plan Problem One  Risk for readmission related to multiple ED visits for falls and recent SNF discharge   Role Documenting the Problem One  Care Management Coordinator  Care Plan for Problem One  Active  [facility] Long Term Goal   Over the next 31 days, patient will not experience hospital readmission as evidenced by review in EMR and patient/caregiver reporting [facility]   [facility] Start Date  02/03/17  Interventions for Problem One Long Term Goal  initial home visit done, medications reviewed, upcoming MD visit   [facility] CM Short Term Goal #1   Pt would have no recurrent falls in the next 20 days   [facility] CM Short Term Goal #1 Start Date  02/12/17  Interventions for Short Term Goal #1  Discussed with pt ongoing use of walker when ambulating.   [facility] CM Short Term Goal #2   Pt's mobility will continue to improve in the next 20 days   [facility] CM Short Term Goal #2 Start Date  02/12/17  Interventions for Short Term Goal #2  Discussed with pt current mobility status- [facility], doing exercises provided by PT.      Shayne Alkenose M.   Pierzchala RN CCM [facility] Care [facility]  [phone]

## 2017-02-20 ENCOUNTER — Other Ambulatory Visit: Payer: Self-pay | Admitting: *Deleted

## 2017-02-20 ENCOUNTER — Other Ambulatory Visit: Payer: Self-pay | Admitting: Family Medicine

## 2017-02-20 ENCOUNTER — Ambulatory Visit (INDEPENDENT_AMBULATORY_CARE_PROVIDER_SITE_OTHER): Payer: PPO | Admitting: Family Medicine

## 2017-02-20 ENCOUNTER — Encounter: Payer: Self-pay | Admitting: Family Medicine

## 2017-02-20 VITALS — BP 122/82 | HR 78 | Temp 97.4°F | Resp 16 | Ht 64.0 in | Wt 121.7 lb

## 2017-02-20 DIAGNOSIS — I10 Essential (primary) hypertension: Secondary | ICD-10-CM | POA: Diagnosis not present

## 2017-02-20 DIAGNOSIS — N390 Urinary tract infection, site not specified: Secondary | ICD-10-CM | POA: Diagnosis not present

## 2017-02-20 DIAGNOSIS — M5136 Other intervertebral disc degeneration, lumbar region: Secondary | ICD-10-CM

## 2017-02-20 DIAGNOSIS — R197 Diarrhea, unspecified: Secondary | ICD-10-CM | POA: Diagnosis not present

## 2017-02-20 DIAGNOSIS — N3001 Acute cystitis with hematuria: Secondary | ICD-10-CM

## 2017-02-20 DIAGNOSIS — E559 Vitamin D deficiency, unspecified: Secondary | ICD-10-CM | POA: Diagnosis not present

## 2017-02-20 DIAGNOSIS — F0150 Vascular dementia without behavioral disturbance: Secondary | ICD-10-CM

## 2017-02-20 DIAGNOSIS — E039 Hypothyroidism, unspecified: Secondary | ICD-10-CM

## 2017-02-20 LAB — POCT URINALYSIS DIPSTICK
Bilirubin, UA: NEGATIVE
GLUCOSE UA: NEGATIVE
Ketones, UA: NEGATIVE
Nitrite, UA: POSITIVE
PH UA: 6 (ref 5.0–8.0)
Protein, UA: NEGATIVE
SPEC GRAV UA: 1.01 (ref 1.010–1.025)
UROBILINOGEN UA: 0.2 U/dL

## 2017-02-20 MED ORDER — SULFAMETHOXAZOLE-TRIMETHOPRIM 800-160 MG PO TABS: 1.0000 | ORAL_TABLET | Freq: Two times a day (BID) | ORAL | 0 refills | Status: DC

## 2017-02-20 NOTE — Patient Instructions (Signed)
May use OTC Imodium as needed for diarrhea and OTC Aleve as needed for back pain but avoid daily use.

## 2017-02-20 NOTE — Progress Notes (Signed)
Date:  02/20/2017   Name:  Teresa Mosley   DOB:  1927-05-02   MRN:  [id number]  PCP:  Schuyler AmorPlonk, Christyann Manolis, MD    Chief Complaint: Urinary Frequency   History of Present Illness:  This is a 83 y.o. female seen for one month f/u. C/o not feeling well x 4 days. Has intermittent diarrhea, wants to know what she can take. Also having intermittent back pain, Tylenol not always effective. Remeron caused excessive sedation so stopped, appetite and mood ok except with UTI. Still taking amlo and vit C, never got message to stop. GERD sxs ok off PPI, off B12, vit D dose increased. Receiving home PT/OT/ST.  Review of Systems:  Review of Systems  Constitutional: Negative for chills and fever.  Respiratory: Negative for cough and shortness of breath.   Cardiovascular: Negative for chest pain and leg swelling.  Gastrointestinal: Negative for abdominal pain.  Genitourinary: Negative for dysuria and flank pain.  Neurological: Negative for syncope and light-headedness.    Patient Active Problem List   Diagnosis Date Noted  . Vascular dementia 01/29/2017  . Recurrent UTI 01/29/2017  . Poor appetite 01/29/2017  . GERD (gastroesophageal reflux disease) 01/29/2017  . DDD (degenerative disc disease), lumbar 01/29/2017  . Hypothyroidism 01/28/2017  . Vitamin D deficiency 01/28/2017  . Hypertension 01/28/2017  . Acute cystitis without hematuria 01/10/2017  . Age-related osteoporosis without current pathological fracture 12/11/2016  . B12 deficiency 09/05/2016  . Closed compression fracture of thoracic vertebra (HCC) 03/08/2016  . Hyperlipidemia, mixed 08/29/2015  . Aortic insufficiency 06/27/2013  . Lumbar disc disorder with myelopathy 06/27/2013    Prior to Admission medications   Medication Sig Start Date End Date Taking? Authorizing Provider  acetaminophen (TYLENOL) 500 MG tablet Take 2 tablets (1,000 mg total) by mouth 2 (two) times daily. 01/29/17  Yes Daschel Roughton, Chrissie NoaWilliam, MD  Cholecalciferol  (VITAMIN D3) 2000 units capsule Take 2,000 Units by mouth daily.   Yes [provider]  levothyroxine (SYNTHROID, LEVOTHROID) 100 MCG tablet Take 100 mcg by mouth daily before breakfast.   Yes [provider]  losartan (COZAAR) 50 MG tablet Take 50 mg daily by mouth.   Yes [provider]  metoprolol succinate (TOPROL-XL) 25 MG 24 hr tablet Take 25 mg daily by mouth.   Yes [provider]  sulfamethoxazole-trimethoprim (BACTRIM DS,SEPTRA DS) 800-160 MG tablet Take 1 tablet by mouth 2 (two) times daily. 02/20/17   Schuyler AmorPlonk, Reilley Latorre, MD    Allergies  Allergen Reactions  . Duloxetine Shortness Of Breath  . Ace Inhibitors Cough  . Codeine Nausea And Vomiting  . Shellfish Allergy Nausea And Vomiting  . Alendronate Nausea Only    Past Surgical History:  Procedure Laterality Date  . ABDOMINAL HYSTERECTOMY  1975  . APPENDECTOMY  1956  . EXCISION MORTON'S NEUROMA Bilateral   . EYE SURGERY Bilateral    Cataract Extraction with IOL  . KYPHOPLASTY N/A 01/16/2016   Procedure: KYPHOPLASTY T11;  Surgeon: Kennedy BuckerMichael Menz, MD;  Location: [facility] ORS;  Service: Orthopedics;  Laterality: N/A;    Social History   Tobacco Use  . Smoking status: Never Smoker  . Smokeless tobacco: Never Used  Substance Use Topics  . Alcohol use: No    Frequency: Never    Comment: occassional wine  . Drug use: No    Family History  Problem Relation Age of Onset  . Hypertension Mother   . Heart attack Father   . Stroke Father   . COPD Brother  Medication list has been reviewed and updated.  Physical Examination: BP 122/82   Pulse 78   Temp (!) 97.4 F (36.3 C)   Resp 16   Ht 5\' 4"  (1.626 m)   Wt 121 lb 11.2 oz (55.2 kg)   SpO2 98%   BMI 20.89 kg/m   Physical Exam  Constitutional: She appears well-developed and well-nourished.  Cardiovascular: Normal rate, regular rhythm and normal heart sounds.  Pulmonary/Chest: Effort normal and breath sounds normal.   Musculoskeletal: She exhibits no edema.  Neurological: She is alert.  Skin: Skin is warm and dry.  Psychiatric: She has a normal mood and affect. Her behavior is normal.  Nursing note and vitals reviewed.   Assessment and Plan:  1. Acute cystitis with hematuria UA shows lg blood, lg leuk, pos nit, Bactrim DS bid x 3d, not enough urine for culture, call if worsen/persist - POCT Urinalysis Dipstick  2. Recurrent UTI Unclear benefit vit C, d/c  3. Essential hypertension Well controlled on current regimen, d/c amlodipine  4. Hypothyroidism, unspecified type Well controlled on Synthroid  5. Vascular dementia without behavioral disturbance Re-evaluate next visit given UTI  6. DDD (degenerative disc disease), lumbar Advised Tylenol bid regularly or Aleve prn but avoid daily use  7. Diarrhea, unspecified type May use Imodium prn but avoid daily use  8. Vitamin D deficiency On increased supplement  Return in about 3 months (around 05/21/2017).  Dionne AnoWilliam M. Kingsley SpittlePlonk, Jr. MD [facility]  02/20/2017

## 2017-02-20 NOTE — Patient Outreach (Signed)
[facility]) Care Management  02/20/2017  Lucile ShuttersMargaret J Macnaughton [birth year] [id number]   Phone call to patient's daughter to confirm the start of Homecare Providers, in home care. Patient's daughter will be returning home to [place] and daughter appreciates this bridge in order to have time to arrange long term in home care for patient. Patient's daughter reports no falls since her discharge form rehab. Per patient's daughter, the aid was there during this social workers phone call and all seemed to be going well. Patient's son still seems hesitant to provide care and the will probably have to hire a private duty aid to provide care when she is not in town.   Plan: This Child psychotherapistsocial worker will follow up with patient within 2 weeks to assess for continued social work needs.   Adriana ReamsChrystal Kevin Space, LCSW [facility] Care Management [phone]

## 2017-02-21 ENCOUNTER — Ambulatory Visit: Payer: PPO | Admitting: Family Medicine

## 2017-02-24 ENCOUNTER — Telehealth: Payer: Self-pay

## 2017-02-24 ENCOUNTER — Other Ambulatory Visit: Payer: Self-pay | Admitting: Family Medicine

## 2017-02-24 ENCOUNTER — Ambulatory Visit: Payer: Self-pay | Admitting: *Deleted

## 2017-02-24 DIAGNOSIS — I10 Essential (primary) hypertension: Secondary | ICD-10-CM | POA: Diagnosis not present

## 2017-02-24 DIAGNOSIS — E039 Hypothyroidism, unspecified: Secondary | ICD-10-CM | POA: Diagnosis not present

## 2017-02-24 DIAGNOSIS — N3000 Acute cystitis without hematuria: Secondary | ICD-10-CM | POA: Diagnosis not present

## 2017-02-24 DIAGNOSIS — R296 Repeated falls: Secondary | ICD-10-CM | POA: Diagnosis not present

## 2017-02-24 DIAGNOSIS — M5106 Intervertebral disc disorders with myelopathy, lumbar region: Secondary | ICD-10-CM | POA: Diagnosis not present

## 2017-02-24 NOTE — Telephone Encounter (Signed)
No they said it like they just wanted the records updated incase any info needed sent to old doctor. I explained not necessary.

## 2017-02-24 NOTE — Telephone Encounter (Signed)
Ok to send updated med list to Dr. Hyacinth MeekerMiller. Will she be seeing him for her primary care in future?

## 2017-02-24 NOTE — Telephone Encounter (Signed)
Daughter called to make us aware UTI is back and she started her on some Keflex she had there that is 500 mg 4 times daily. Also asked that we send updated Rx list to Dr.Miller office for his records.

## 2017-02-26 ENCOUNTER — Other Ambulatory Visit: Payer: Self-pay | Admitting: *Deleted

## 2017-02-26 ENCOUNTER — Ambulatory Visit: Payer: Self-pay | Admitting: *Deleted

## 2017-02-26 NOTE — Patient Outreach (Signed)
[facility]) Care Management  02/26/2017  Teresa ShuttersMargaret J Bulger Aug 03, 1927 [id number]   Transition of care call    83 year old female - [facility] CM 01/31/17 for transition of care.   Hx of multiple falls resulting in several ED visits and recent SNF visit for rehab, discharged from SNF to home 01/20/17.   Pt's history includes but not limited to GERD, recurrent UTI, chronic back/leg pain, aortic insufficiency, Essential HTN.    Successful outreach call to Rockwell AlexandriaBecky Mitchell daughter of patient, HIPAA identifiers obtained, explained that I am covering for assigned care manager,  daughter from of out of state is currently staying with patient. Daughter states patient still has some weakness, not moving as well, some confusion and that she has another urinary tract infection.  Daughter discussed patient recently completed course of sulfa drug for infection and she just started her own Keflex 2 days ago,  Daughter discussed she plans to notify Dr.Plonk on today of symptoms and arrange appointment,so patient can be seen prior to daughter returning to [place]. Daughter discussed some symptoms may be related to depression knowing that daughter is planning to leave soon.  Daughter discussed home health physical therapy hasn't been as often due to patient not feeling like participating. Home care providers hasn't been on this week, due to daughter being in home to assist patient, she plans to follow up on private pay sitter for when she leaves town. Patient lives at home with her son .   Reports patient eating is what she prepares and has supplement drink with meals ,if she drinks it outside of meal she want eat food.   [name] denies any other concerns at this time, and plans to follow up Dr.Plonk office today.  Plan Patient will remain active with transition of care program, and weekly outreaches, will update assigned care  manager, and informed daughter of telephone follow up in the next week.

## 2017-02-27 ENCOUNTER — Ambulatory Visit: Payer: Self-pay | Admitting: *Deleted

## 2017-02-27 ENCOUNTER — Telehealth: Payer: Self-pay

## 2017-02-27 NOTE — Telephone Encounter (Signed)
We can do here but does she need OV or can we just collect urine?

## 2017-02-27 NOTE — Telephone Encounter (Signed)
Just dip urine.

## 2017-02-27 NOTE — Telephone Encounter (Signed)
Needs urine rechecked as we were unable to send cx last visit, can do here or at urgent care.

## 2017-02-27 NOTE — Telephone Encounter (Signed)
Left message needs to come leave a urine sample w Teresa MuirJamie

## 2017-02-27 NOTE — Telephone Encounter (Signed)
Daughter had planned to leave to go back home from out of state and now patient i s extremely weak and afraid may have UTI still. Daughter not sure if this is an act to make her stay or if this is UTI. Still on Keflex 4 times daily with no diarrhea. Any recommendations regarding weakness and UTI?

## 2017-02-28 ENCOUNTER — Other Ambulatory Visit: Payer: Self-pay | Admitting: *Deleted

## 2017-02-28 NOTE — Telephone Encounter (Signed)
Patient is feeling better. Not coming to check urine at this time.

## 2017-02-28 NOTE — Patient Outreach (Signed)
[facility]) Care Management  02/28/2017  Teresa Mosley 08-05-1927 [id number]   Return phone call to patient's daughter Teresa Mosley with request for assistance with out of home care. . Per patient, she was made aware of patient's Vascular Dementia diagnosis which caused increased anxiety. Per patient's daughter, she will return to [place] today and patient is afraid to stay alone. They have now  arranged for the housekeeper to come sit with patient  when her son can't be there. Home care providers on hold right now until after christmas. Teresa Mosley has placed an additional table/phone next to her in the den for accessibility.  Per Teresa Mosley, she was told that patient has been diagnosed with Vascular Dementia [facility]. Per Teresa Mosley, [facility] answered all of her questions regarding vascula dementia and helped to  calm her anxiety. She also researched the condition on line.  She now  feels that patient should be able to stay home with assistance and extra in home support.  Patient's son remains anxious, and Teresa Mosley would like this Child psychotherapistsocial worker to speak with him regarding his concerns.   Plan: Home visit to be scheduled to see patient and her son within 2 weeks.

## 2017-03-05 ENCOUNTER — Other Ambulatory Visit: Payer: Self-pay | Admitting: *Deleted

## 2017-03-05 NOTE — Patient Outreach (Signed)
Successful telephone encounter to daughter Teresa Mosley (main contact, on consent form) for Teresa Mosley, 83 year old female for final transition of care call/ongoing follow up on recent SNF discharge 01/20/17, history of multiple falls resulting in several ED visits.  Pt's history also includes but not limited to GERD, recurrent UTI, chronic back/leg pain, aortic insufficiency, essential hypertension.   Spoke with daughter,HIPAA identifiers provided on pt.   Daughter reports on pt's recent UTI, pt completed antibiotic/follow up call to PCP did not have to provide a urine sample as pt was much improved.   Daughter reports [place] returned home ([place]), Home care providers started, had pt talk to housekeeper about sitting with her when needed to which pt did agreed.  Daughter reports pt has not had any falls, using her walker and cane, continues to [facility] - progression slow, not seeing pt getting any stronger.   Daughter reports got a message from her brother (lives with pt) yesterday pt has no appetite to which she is aware pt does eats like a bird, prepared meals for her before she left/pt continues to take nutritional supplements to which she emphasized to pt not to substitute for a meal. Daughter reports no appetite could be related to yesterday being Christmas and her not being there.  Daughter reports pt is taking all of her medications.   Daughter reports she was [facility] that pt has Vascular Dementia to which she was not aware, researched it to become more familiar.  Daughter reports she knows she will be coming back again to be with her Mom.  Discussed with daughter today is RN CM's final transition of care call, plan to follow up with pt telephonically next week as daughter reported did get pt a new home phone.    Plan:  RN CM to complete transition of care program.           As discussed with daughter, plan to follow up with pt next week telephonically- check on clinical status.   Shayne Alkenose  M.   Janney Priego RN CCM [facility] Care [facility]  [phone]

## 2017-03-11 DIAGNOSIS — M5106 Intervertebral disc disorders with myelopathy, lumbar region: Secondary | ICD-10-CM | POA: Diagnosis not present

## 2017-03-11 DIAGNOSIS — I10 Essential (primary) hypertension: Secondary | ICD-10-CM | POA: Diagnosis not present

## 2017-03-11 DIAGNOSIS — N3000 Acute cystitis without hematuria: Secondary | ICD-10-CM | POA: Diagnosis not present

## 2017-03-11 DIAGNOSIS — R296 Repeated falls: Secondary | ICD-10-CM | POA: Diagnosis not present

## 2017-03-11 DIAGNOSIS — E039 Hypothyroidism, unspecified: Secondary | ICD-10-CM | POA: Diagnosis not present

## 2017-03-13 ENCOUNTER — Other Ambulatory Visit: Payer: Self-pay | Admitting: *Deleted

## 2017-03-13 NOTE — Patient Outreach (Signed)
Successful telephone encounter to [facility] Sporrer, 84 year old female for follow up call, check on current clinical status.   RN CM followed pt for transition of care/recent SNF discharge 01/20/17, history of multiple falls resulting in several ED visits. Transition of care program completed 03/05/17.   Pt reports taking all of her medications, local pharmacy fills 4 pill boxes at a time.   Pt reports mobility doing some better, HH PT continues to work with her. Pt reports home care providers coming two times a week, housekeeper unable to come more frequently/currently sick.    Pt reports no signs/symptoms of UTI.   RN CM discussed  scheduling a home visit for  later this month to which pt agreed to plan.    Plan:  As discussed with pt, plan to follow up again in 3 weeks  with home visit.   Shayne Alkenose M.   Pierzchala RN CCM [facility] Care [facility]  [phone]

## 2017-03-17 DIAGNOSIS — R296 Repeated falls: Secondary | ICD-10-CM | POA: Diagnosis not present

## 2017-03-17 DIAGNOSIS — M5106 Intervertebral disc disorders with myelopathy, lumbar region: Secondary | ICD-10-CM | POA: Diagnosis not present

## 2017-03-17 DIAGNOSIS — E039 Hypothyroidism, unspecified: Secondary | ICD-10-CM | POA: Diagnosis not present

## 2017-03-17 DIAGNOSIS — N3000 Acute cystitis without hematuria: Secondary | ICD-10-CM | POA: Diagnosis not present

## 2017-03-17 DIAGNOSIS — I10 Essential (primary) hypertension: Secondary | ICD-10-CM | POA: Diagnosis not present

## 2017-03-18 ENCOUNTER — Ambulatory Visit: Payer: Self-pay | Admitting: *Deleted

## 2017-03-19 ENCOUNTER — Other Ambulatory Visit: Payer: Self-pay | Admitting: *Deleted

## 2017-03-19 NOTE — Patient Outreach (Signed)
[facility]) Care Management  03/19/2017  Teresa Mosley [birth year] [id number]   Follow up phone call to patient to to assess for communithy resource needs and to assess for fall risk. Per patient, her son continues to reside with her but offers minimal support. Per patient, however her son will assist when asked. Patient continues to have Home Care Providers to assist with ADL's and light housekeeping twice a week. PT and OT is also still involved. Her current housekeeper has agreed to increase her hours, however has been sick and unable to make it. Per patient, she did contact her housekeeper today and she plans to return to work next week. Patient states that she has not had any falls since her daughter [name] return to [place] several weeks ago.  She is not sure when [name] will return, however she talks with her every evening.   Plan: This Child psychotherapistsocial worker will plan a home visit within the next 3 weeks.   Teresa ReamsChrystal Nikkie Liming, LCSW [facility] Care Management [phone]

## 2017-04-03 ENCOUNTER — Other Ambulatory Visit: Payer: Self-pay | Admitting: *Deleted

## 2017-04-03 ENCOUNTER — Ambulatory Visit: Payer: PPO | Admitting: Family Medicine

## 2017-04-03 ENCOUNTER — Encounter: Payer: Self-pay | Admitting: Family Medicine

## 2017-04-03 ENCOUNTER — Other Ambulatory Visit: Payer: Self-pay | Admitting: Family Medicine

## 2017-04-03 VITALS — BP 126/76 | HR 76 | Resp 16 | Ht 64.0 in | Wt 120.2 lb

## 2017-04-03 DIAGNOSIS — R350 Frequency of micturition: Secondary | ICD-10-CM | POA: Diagnosis not present

## 2017-04-03 DIAGNOSIS — E538 Deficiency of other specified B group vitamins: Secondary | ICD-10-CM | POA: Diagnosis not present

## 2017-04-03 DIAGNOSIS — E559 Vitamin D deficiency, unspecified: Secondary | ICD-10-CM

## 2017-04-03 DIAGNOSIS — I10 Essential (primary) hypertension: Secondary | ICD-10-CM

## 2017-04-03 DIAGNOSIS — E039 Hypothyroidism, unspecified: Secondary | ICD-10-CM | POA: Diagnosis not present

## 2017-04-03 LAB — POCT URINALYSIS DIPSTICK
Bilirubin, UA: NEGATIVE
GLUCOSE UA: NEGATIVE
Ketones, UA: NEGATIVE
LEUKOCYTES UA: NEGATIVE
NITRITE UA: NEGATIVE
PROTEIN UA: NEGATIVE
RBC UA: NEGATIVE
SPEC GRAV UA: 1.015 (ref 1.010–1.025)
Urobilinogen, UA: 0.2 E.U./dL
pH, UA: 5 (ref 5.0–8.0)

## 2017-04-03 NOTE — Progress Notes (Signed)
Date:  04/03/2017   Name:  Teresa Mosley   DOB:  17-Apr-1927   MRN:  [id number]  PCP:  Schuyler AmorPlonk, Sakura Denis, MD    Chief Complaint: Urinary Frequency   History of Present Illness:  This is a 84 y.o. female seen same day for urinary frequency and increased confusion x 4d. Had UTI last month rx'd successfully with Bactrim. HTN off amlodipine, off B12, on increased vit D supplement.  Review of Systems:  Review of Systems  Constitutional: Negative for chills and fever.  HENT: Negative for sore throat.   Respiratory: Negative for cough and shortness of breath.   Cardiovascular: Negative for chest pain and leg swelling.  Gastrointestinal: Negative for abdominal pain.  Genitourinary: Negative for difficulty urinating and dysuria.  Neurological: Negative for syncope and light-headedness.    Patient Active Problem List   Diagnosis Date Noted  . Vascular dementia 01/29/2017  . Recurrent UTI 01/29/2017  . Poor appetite 01/29/2017  . GERD (gastroesophageal reflux disease) 01/29/2017  . DDD (degenerative disc disease), lumbar 01/29/2017  . Hypothyroidism 01/28/2017  . Vitamin D deficiency 01/28/2017  . Hypertension 01/28/2017  . Acute cystitis without hematuria 01/10/2017  . Age-related osteoporosis without current pathological fracture 12/11/2016  . B12 deficiency 09/05/2016  . Closed compression fracture of thoracic vertebra (HCC) 03/08/2016  . Hyperlipidemia, mixed 08/29/2015  . Aortic insufficiency 06/27/2013  . Lumbar disc disorder with myelopathy 06/27/2013    Prior to Admission medications   Medication Sig Start Date End Date Taking? Authorizing Provider  acetaminophen (TYLENOL) 500 MG tablet Take 2 tablets (1,000 mg total) by mouth 2 (two) times daily. 01/29/17  Yes Tasneem Cormier, Chrissie NoaWilliam, MD  Cholecalciferol (VITAMIN D3) 2000 units capsule Take 2,000 Units by mouth daily.   Yes [provider]  levothyroxine (SYNTHROID, LEVOTHROID) 100 MCG tablet Take 100 mcg by mouth daily  before breakfast.   Yes [provider]  losartan (COZAAR) 50 MG tablet Take 50 mg daily by mouth.   Yes [provider]  metoprolol succinate (TOPROL-XL) 25 MG 24 hr tablet Take 25 mg daily by mouth.   Yes [provider]    Allergies  Allergen Reactions  . Duloxetine Shortness Of Breath  . Ace Inhibitors Cough  . Codeine Nausea And Vomiting  . Shellfish Allergy Nausea And Vomiting  . Alendronate Nausea Only    Past Surgical History:  Procedure Laterality Date  . ABDOMINAL HYSTERECTOMY  1975  . APPENDECTOMY  1956  . EXCISION MORTON'S NEUROMA Bilateral   . EYE SURGERY Bilateral    Cataract Extraction with IOL  . KYPHOPLASTY N/A 01/16/2016   Procedure: KYPHOPLASTY T11;  Surgeon: Kennedy BuckerMichael Menz, MD;  Location: [facility] ORS;  Service: Orthopedics;  Laterality: N/A;    Social History   Tobacco Use  . Smoking status: Never Smoker  . Smokeless tobacco: Never Used  Substance Use Topics  . Alcohol use: No    Frequency: Never    Comment: occassional wine  . Drug use: No    Family History  Problem Relation Age of Onset  . Hypertension Mother   . Heart attack Father   . Stroke Father   . COPD Brother     Medication list has been reviewed and updated.  Physical Examination: BP 126/76   Pulse 76   Resp 16   Ht 5\' 4"  (1.626 m)   Wt 120 lb 3.2 oz (54.5 kg)   SpO2 99%   BMI 20.63 kg/m   Physical Exam  Constitutional: She appears well-developed  and well-nourished.  Cardiovascular: Normal rate, regular rhythm and normal heart sounds.  Pulmonary/Chest: Effort normal and breath sounds normal.  Abdominal: Soft. She exhibits no distension. There is no tenderness.  Musculoskeletal: She exhibits no edema.  Neurological: She is alert.  Skin: Skin is warm and dry.  Psychiatric: She has a normal mood and affect. Her behavior is normal.  Nursing note and vitals reviewed.   Assessment and Plan:  1. Urine frequency UA negative, cx sent - POCT urinalysis  dipstick - Urine Culture  2. Essential hypertension Well controlled off amlodipine - CBC - Basic Metabolic Panel (BMET)  3. Hypothyroidism, unspecified type Well controlled on Synthroid  4. B12 deficiency Off supplement - B12  5. Vitamin D deficiency On increased supplement, hx comp fxs - Vitamin D (25 hydroxy)  Return in about 6 weeks (around 05/15/2017).  Dionne Ano. Kingsley Spittle MD [facility]  04/03/2017

## 2017-04-03 NOTE — Patient Outreach (Signed)
[facility]) Care Management   04/03/2017  Teresa Mosley [birth year] [id number]  Teresa Mosley is an 84 y.o. female  Subjective:  Pt reports to see PCP today, daughter scheduled it, having  Increased urination that started 4 days ago.  Son reports pt had a UTI  2 weeks ago, last night had an episode of confusion.  Pt  reports HH PT continues to come once a week, ambulating better, no recent falls.  Pt  reports [facility] no longer coming, independent with  bathing.  Pt reports [facility] does her medications for 4  weeks at a time/pill box.  Pt reports weakness improved, no change in Appetite (poor)continues with  one Ensure a day.    Objective:   Vitals:   04/03/17 1324  BP: 128/72  Pulse: 77  Resp: 20  SpO2: 98%    ROS  Physical Exam  Constitutional: She is oriented to person, place, and time. She appears well-developed and well-nourished.  Cardiovascular: Normal rate, regular rhythm and normal heart sounds.  Respiratory: Effort normal and breath sounds normal.  GI: Soft. Bowel sounds are normal.  Musculoskeletal: Normal range of motion.  Neurological: She is alert and oriented to person, place, and time.  Skin: Skin is warm and dry.  Psychiatric: She has a normal mood and affect. Her behavior is normal. Judgment and thought content normal.    Encounter Medications:   Outpatient Encounter Medications as of 04/03/2017  Medication Sig Note  . acetaminophen (TYLENOL) 500 MG tablet Take 2 tablets (1,000 mg total) by mouth 2 (two) times daily. 02/19/2017: As needed.   . Cholecalciferol (VITAMIN D3) 2000 units capsule Take 2,000 Units by mouth daily.   Marland Kitchen levothyroxine (SYNTHROID, LEVOTHROID) 100 MCG tablet Take 100 mcg by mouth daily before breakfast.   . losartan (COZAAR) 50 MG tablet Take 50 mg daily by mouth.   . metoprolol succinate (TOPROL-XL) 25 MG 24 hr tablet Take 25 mg daily by mouth.    No facility-administered  encounter medications on file as of 04/03/2017.     Functional Status:   In your present state of health, do you have any difficulty performing the following activities: 02/19/2017 01/20/2017  Hearing? N N  Vision? N N  Difficulty concentrating or making decisions? Y N  Walking or climbing stairs? N Y  Dressing or bathing? N N  Doing errands, shopping? Malvin Johns  Preparing Food and eating ? N Y  Using the Toilet? N N  In the past six months, have you accidently leaked urine? N N  Do you have problems with loss of bowel control? N N  Managing your Medications? Y Y  Managing your Finances? Malvin Johns  Housekeeping or managing your Housekeeping? Y -  Some recent data might be hidden    Fall/Depression Screening:    Fall Risk  02/03/2017 01/28/2017 01/17/2017  Falls in the past year? Yes Yes Yes  Number falls in past yr: 2 or more 2 or more 1  Injury with Fall? Yes - Yes  Risk Factor Category  High Fall Risk High Fall Risk -  Risk for fall due to : History of fall(s);Impaired balance/gait;Impaired mobility - Impaired balance/gait;Impaired mobility  Follow up Falls prevention discussed;Education provided - Education provided   [facility] 2/9 Scores 01/28/2017 01/17/2017  PHQ - 2 Score 0 0    Assessment:  Pleasant 84 year old female, son lives with pt/present during part of visit  Today.   RN CM  followed pt for transition of care/recent SNF discharge 01/20/17, history of multiple falls resulting in several ED visits.  Transition of care program  Completed 03/05/17.  Pt's history includes but not limited to Osteoporosis, HTN, Recurrent UTI, GERD, poor appetite, Degenerative disc disease, Vascular  Dementia, Aortic insufficiency.    Hx of falls:  Observed pt ambulating well with quad cane, per pt no recent         Falls.    Urinary status: per pt increased frequency last 4 days, per son confusion noted.        Pt to follow up with PCP today.    Appetite: per pt no change, RN CM discussed ongoing use of  Ensure.    Plan:  As discussed with pt and son, plan to discharge from [facility] services-         No further case management needs.   Provided pt with RN CM's contact information      As well as [facility]'s main office number to call if needs arise in the future.             Plan to inform Teresa Mosley [facility] LCSW of discharge.             Plan to inform Dr. Hollace HaywardPlonk of discharge, send update letter.   [facility] CM Care Plan Problem Two     Most Recent Value  Care Plan Problem Two  Weakness, poor nutrition   Role Documenting the Problem Two  Care Management Coordinator  Care Plan for Problem Two  Not Active  [facility] CM Short Term Goal #2   Pt would see improvement in nutrition/appetite within the next 30 days   [facility] CM Short Term Goal #2 Start Date  03/05/17  Interventions for Short Term Goal #2  Discussed with pt current appetite- per pt no change      Teresa M.   Pierzchala RN CCM [facility] Care Management  [phone]

## 2017-04-04 ENCOUNTER — Encounter: Payer: Self-pay | Admitting: *Deleted

## 2017-04-04 LAB — BASIC METABOLIC PANEL
BUN/Creatinine Ratio: 29 — ABNORMAL HIGH (ref 12–28)
BUN: 23 mg/dL (ref 8–27)
CALCIUM: 10.1 mg/dL (ref 8.7–10.3)
CO2: 23 mmol/L (ref 20–29)
CREATININE: 0.8 mg/dL (ref 0.57–1.00)
Chloride: 106 mmol/L (ref 96–106)
GFR calc Af Amer: 77 mL/min/{1.73_m2} (ref >59)
GFR, EST NON AFRICAN AMERICAN: 67 mL/min/{1.73_m2} (ref >59)
GLUCOSE: 96 mg/dL (ref 65–99)
POTASSIUM: 4.8 mmol/L (ref 3.5–5.2)
Sodium: 142 mmol/L (ref 134–144)

## 2017-04-04 LAB — CBC
HEMATOCRIT: 40.3 % (ref 34.0–46.6)
HEMOGLOBIN: 13.2 g/dL (ref 11.1–15.9)
MCH: 31.1 pg (ref 26.6–33.0)
MCHC: 32.8 g/dL (ref 31.5–35.7)
MCV: 95 fL (ref 79–97)
Platelets: 145 10*3/uL — ABNORMAL LOW (ref 150–379)
RBC: 4.25 x10E6/uL (ref 3.77–5.28)
RDW: 13.8 % (ref 12.3–15.4)
WBC: 4.5 10*3/uL (ref 3.4–10.8)

## 2017-04-04 LAB — VITAMIN D 25 HYDROXY (VIT D DEFICIENCY, FRACTURES): Vit D, 25-Hydroxy: 48.2 ng/mL (ref 30.0–100.0)

## 2017-04-04 LAB — VITAMIN B12: VITAMIN B 12: 1015 pg/mL (ref 232–1245)

## 2017-04-05 LAB — URINE CULTURE

## 2017-04-08 ENCOUNTER — Other Ambulatory Visit: Payer: Self-pay | Admitting: *Deleted

## 2017-04-08 DIAGNOSIS — E039 Hypothyroidism, unspecified: Secondary | ICD-10-CM | POA: Diagnosis not present

## 2017-04-08 DIAGNOSIS — I10 Essential (primary) hypertension: Secondary | ICD-10-CM | POA: Diagnosis not present

## 2017-04-08 DIAGNOSIS — R296 Repeated falls: Secondary | ICD-10-CM | POA: Diagnosis not present

## 2017-04-08 DIAGNOSIS — N3000 Acute cystitis without hematuria: Secondary | ICD-10-CM | POA: Diagnosis not present

## 2017-04-08 NOTE — Patient Outreach (Signed)
[facility]) Care Management  04/08/2017  Teresa Mosley [birth year] [id number]   Home visit scheduled for 04/09/17 to assess for continued social work involvement and any community resource needs as [facility] their in home service and patient's daughter has returned to [place]. Patient's son is now her primary caretaker.  Home visit scheduled for 04/09/17 at 10:30am   Adriana ReamsChrystal Nabila Albarracin, LCSW [facility] Care Management [phone]

## 2017-04-09 ENCOUNTER — Other Ambulatory Visit: Payer: Self-pay | Admitting: *Deleted

## 2017-04-09 ENCOUNTER — Encounter: Payer: Self-pay | Admitting: *Deleted

## 2017-04-09 NOTE — Patient Outreach (Signed)
  [facility]) Care Management  [facility]  04/09/2017  Teresa Mosley 03-13-1927 [id number]  Subjective:  Patient is a 84 year female. History of multiple falls resulting in several ED visits and recent SNF stay. Per patient, HH PT continues to come once per week, however will come twice next week. [facility] is sno longer coming, however she has a housekeeper that comes 1x per week and helps her with her bath. [facility] fills her pill box (4 weeks at a time) Patient reports no recent falls and uses her caine while ambulating.  Patient states that her appetite remains poor, and continues to drink one Ensure per pay. Per patient, she has discussed this with her doctor. Patient's daughter has returned to [place], however calls and checks on patient daily. Patient's son son lives with her and provides transport to medical appointments and helps with meals or will order out.  Patient's son admits to depression and receives medication management and group therapy thorugh [facility]. Objective:   Encounter Medications:  Outpatient Encounter Medications as of 04/09/2017  Medication Sig Note  . acetaminophen (TYLENOL) 500 MG tablet Take 2 tablets (1,000 mg total) by mouth 2 (two) times daily. 02/19/2017: As needed.   . Cholecalciferol (VITAMIN D3) 2000 units capsule Take 2,000 Units by mouth daily.   Marland Kitchen. levothyroxine (SYNTHROID, LEVOTHROID) 100 MCG tablet Take 100 mcg by mouth daily before breakfast.   . losartan (COZAAR) 50 MG tablet Take 50 mg daily by mouth.   . metoprolol succinate (TOPROL-XL) 25 MG 24 hr tablet Take 25 mg daily by mouth.    No facility-administered encounter medications on file as of 04/09/2017.     Functional Status:  In your present state of health, do you have any difficulty performing the following activities: 02/19/2017 01/20/2017  Hearing? N N  Vision? N N  Difficulty concentrating or making decisions? Y N  Walking  or climbing stairs? N Y  Dressing or bathing? N N  Doing errands, shopping? Malvin JohnsY Y  Preparing Food and eating ? N Y  Using the Toilet? N N  In the past six months, have you accidently leaked urine? N N  Do you have problems with loss of bowel control? N N  Managing your Medications? Y Y  Managing your Finances? Malvin JohnsY Y  Housekeeping or managing your Housekeeping? Y -  Some recent data might be hidden    Fall/Depression Screening:  PHQ 2/9 Scores 01/28/2017 01/17/2017  PHQ - 2 Score 0 0    Assessment: Visit with patient today was very pleasant.Visit was to determine plan of care following [facility] services ending. Patient's son is primary caregiver and states that he transports patient to all of her medical appointments and is able to cook some meals. Housekeeper comes 1x per week and assists with bathing, patient washes up otherwise.   Patient observed ambulating well with her walker. Patient verbalized having no additional community resource needs. Meals on Wheels discussed, however since patient's son in available to obtain meals for patient, she does not qualify for this service.  Plan: Patient to be closed to [facility] at this time.    Adriana ReamsChrystal Cleatus Gabriel, LCSW [facility] Care Management [phone]

## 2017-05-15 ENCOUNTER — Ambulatory Visit (INDEPENDENT_AMBULATORY_CARE_PROVIDER_SITE_OTHER): Payer: PPO | Admitting: Family Medicine

## 2017-05-15 ENCOUNTER — Encounter: Payer: Self-pay | Admitting: Family Medicine

## 2017-05-15 VITALS — BP 102/78 | HR 90 | Resp 16 | Ht 62.0 in | Wt 113.0 lb

## 2017-05-15 DIAGNOSIS — E039 Hypothyroidism, unspecified: Secondary | ICD-10-CM

## 2017-05-15 DIAGNOSIS — R296 Repeated falls: Secondary | ICD-10-CM | POA: Insufficient documentation

## 2017-05-15 DIAGNOSIS — E559 Vitamin D deficiency, unspecified: Secondary | ICD-10-CM | POA: Diagnosis not present

## 2017-05-15 DIAGNOSIS — R634 Abnormal weight loss: Secondary | ICD-10-CM | POA: Diagnosis not present

## 2017-05-15 DIAGNOSIS — G4762 Sleep related leg cramps: Secondary | ICD-10-CM

## 2017-05-15 DIAGNOSIS — I10 Essential (primary) hypertension: Secondary | ICD-10-CM

## 2017-05-15 MED ORDER — MIRTAZAPINE 7.5 MG PO TABS: 7.5000 mg | ORAL_TABLET | Freq: Every day | ORAL | 2 refills | Status: AC

## 2017-05-15 NOTE — Patient Instructions (Signed)
Stop losartan due to low blood pressure. Begin mirtazapine to help with appetite.

## 2017-05-15 NOTE — Progress Notes (Signed)
Date:  05/15/2017   Name:  Teresa Mosley   DOB:  02/20/1928   MRN:  [id number]  PCP:  Schuyler Amor, MD    Chief Complaint: Hypertension; Fall (Had fall on 05/13/2017 and did not get hurt but a few days before that she fell and hit head and bruised arm but not seen in ER just hurt a little. ); and Leg cramp (Left leg cramps before bed over past few weeks)   History of Present Illness:  This is a 84 y.o. female seen for three month f/u. Has had two falls recently, admits occ lightheadedness. Appetite poor, says gets full easily, losing weight despite Ensure. C/o occ nocturnal leg cramps. Labs ok in Jan.  Review of Systems:  Review of Systems  Constitutional: Negative for chills and fever.  Respiratory: Negative for cough and shortness of breath.   Cardiovascular: Negative for chest pain and leg swelling.  Genitourinary: Negative for difficulty urinating.  Neurological: Negative for syncope and light-headedness.    Patient Active Problem List   Diagnosis Date Noted  . Unintended weight loss 05/15/2017  . Frequent falls 05/15/2017  . Vascular dementia 01/29/2017  . Recurrent UTI 01/29/2017  . Poor appetite 01/29/2017  . GERD (gastroesophageal reflux disease) 01/29/2017  . DDD (degenerative disc disease), lumbar 01/29/2017  . Hypothyroidism 01/28/2017  . Vitamin D deficiency 01/28/2017  . Hypertension 01/28/2017  . Age-related osteoporosis without current pathological fracture 12/11/2016  . B12 deficiency 09/05/2016  . Closed compression fracture of thoracic vertebra (HCC) 03/08/2016  . Hyperlipidemia, mixed 08/29/2015  . Aortic insufficiency 06/27/2013  . Lumbar disc disorder with myelopathy 06/27/2013    Prior to Admission medications   Medication Sig Start Date End Date Taking? Authorizing Provider  acetaminophen (TYLENOL) 500 MG tablet Take 2 tablets (1,000 mg total) by mouth 2 (two) times daily. 01/29/17  Yes Syleena Mchan, Chrissie Noa, MD  Cholecalciferol (VITAMIN D3) 2000 units  capsule Take 2,000 Units by mouth daily.   Yes [provider]  levothyroxine (SYNTHROID, LEVOTHROID) 100 MCG tablet Take 100 mcg by mouth daily before breakfast.   Yes [provider]  metoprolol succinate (TOPROL-XL) 25 MG 24 hr tablet Take 25 mg daily by mouth.   Yes [provider]  mirtazapine (REMERON) 7.5 MG tablet Take 1 tablet (7.5 mg total) by mouth at bedtime. 05/15/17   Schuyler Amor, MD    Allergies  Allergen Reactions  . Duloxetine Shortness Of Breath  . Ace Inhibitors Cough  . Codeine Nausea And Vomiting  . Shellfish Allergy Nausea And Vomiting  . Alendronate Nausea Only    Past Surgical History:  Procedure Laterality Date  . ABDOMINAL HYSTERECTOMY  1975  . APPENDECTOMY  1956  . EXCISION MORTON'S NEUROMA Bilateral   . EYE SURGERY Bilateral    Cataract Extraction with IOL  . KYPHOPLASTY N/A 01/16/2016   Procedure: KYPHOPLASTY T11;  Surgeon: Kennedy Bucker, MD;  Location: [facility] ORS;  Service: Orthopedics;  Laterality: N/A;    Social History   Tobacco Use  . Smoking status: Never Smoker  . Smokeless tobacco: Never Used  Substance Use Topics  . Alcohol use: No    Frequency: Never    Comment: occassional wine  . Drug use: No    Family History  Problem Relation Age of Onset  . Hypertension Mother   . Heart attack Father   . Stroke Father   . COPD Brother     Medication list has been reviewed and updated.  Physical Examination: BP 102/78  Pulse 90   Resp 16   Ht 5\' 2"  (1.575 m)   Wt 113 lb (51.3 kg)   SpO2 98%   BMI 20.67 kg/m   Physical Exam  Constitutional: She appears well-developed and well-nourished.  Cardiovascular: Normal rate, regular rhythm and normal heart sounds.  Pulmonary/Chest: Effort normal and breath sounds normal.  Musculoskeletal: She exhibits no edema.  Neurological: She is alert.  Skin: Skin is warm and dry.  Psychiatric: She has a normal mood and affect. Her behavior is normal.  Nursing note and  vitals reviewed.   Assessment and Plan:  1. Essential hypertension Over-controlled, d/c losartan  2. Hypothyroidism, unspecified type Well controlled on Synthroid  3. Unintended weight loss Trial Remeron qhs  4. Frequent falls May improve off losartan  5. Nocturnal leg cramps May improve off losartan  6. Vitamin D deficiency Well controlled on supplement  Return in about 4 weeks (around 06/12/2017).  Dionne AnoWilliam M. Kingsley SpittlePlonk, Jr. MD [facility]  05/15/2017

## 2017-05-31 DIAGNOSIS — T6591XA Toxic effect of unspecified substance, accidental (unintentional), initial encounter: Secondary | ICD-10-CM | POA: Diagnosis not present

## 2017-05-31 DIAGNOSIS — T23202A Burn of second degree of left hand, unspecified site, initial encounter: Secondary | ICD-10-CM | POA: Diagnosis not present

## 2017-05-31 DIAGNOSIS — T65891A Toxic effect of other specified substances, accidental (unintentional), initial encounter: Secondary | ICD-10-CM | POA: Diagnosis not present

## 2017-05-31 DIAGNOSIS — M1990 Unspecified osteoarthritis, unspecified site: Secondary | ICD-10-CM | POA: Diagnosis not present

## 2017-05-31 DIAGNOSIS — E785 Hyperlipidemia, unspecified: Secondary | ICD-10-CM | POA: Diagnosis not present

## 2017-05-31 DIAGNOSIS — T304 Corrosion of unspecified body region, unspecified degree: Secondary | ICD-10-CM | POA: Diagnosis not present

## 2017-05-31 DIAGNOSIS — T23402A Corrosion of unspecified degree of left hand, unspecified site, initial encounter: Secondary | ICD-10-CM | POA: Diagnosis not present

## 2017-05-31 DIAGNOSIS — T23401A Corrosion of unspecified degree of right hand, unspecified site, initial encounter: Secondary | ICD-10-CM | POA: Diagnosis not present

## 2017-05-31 DIAGNOSIS — T320 Corrosions involving less than 10% of body surface: Secondary | ICD-10-CM | POA: Diagnosis not present

## 2017-05-31 DIAGNOSIS — I351 Nonrheumatic aortic (valve) insufficiency: Secondary | ICD-10-CM | POA: Diagnosis not present

## 2017-05-31 DIAGNOSIS — T23232A Burn of second degree of multiple left fingers (nail), not including thumb, initial encounter: Secondary | ICD-10-CM | POA: Diagnosis not present

## 2017-05-31 DIAGNOSIS — I4891 Unspecified atrial fibrillation: Secondary | ICD-10-CM | POA: Diagnosis not present

## 2017-05-31 DIAGNOSIS — T23231A Burn of second degree of multiple right fingers (nail), not including thumb, initial encounter: Secondary | ICD-10-CM | POA: Diagnosis not present

## 2017-05-31 DIAGNOSIS — T23602A Corrosion of second degree of left hand, unspecified site, initial encounter: Secondary | ICD-10-CM | POA: Diagnosis not present

## 2017-05-31 DIAGNOSIS — I10 Essential (primary) hypertension: Secondary | ICD-10-CM | POA: Diagnosis not present

## 2017-05-31 DIAGNOSIS — T23201A Burn of second degree of right hand, unspecified site, initial encounter: Secondary | ICD-10-CM | POA: Diagnosis not present

## 2017-05-31 DIAGNOSIS — T23601A Corrosion of second degree of right hand, unspecified site, initial encounter: Secondary | ICD-10-CM | POA: Diagnosis not present

## 2017-05-31 DIAGNOSIS — E039 Hypothyroidism, unspecified: Secondary | ICD-10-CM | POA: Diagnosis not present

## 2017-06-04 ENCOUNTER — Other Ambulatory Visit: Payer: Self-pay

## 2017-06-04 NOTE — Patient Outreach (Signed)
[facility]) Care Management  06/04/2017  Lucile ShuttersMargaret J Pettinger 03/01/1928 [id number]   Referral received. No outreach warranted at this time. Transition of Care  will be completed by primary care provider office who will [facility] if needed.  Plan: RN CM will close case at this time.    Bary Lericheionne J Natalye Kott, RN, MSN [facility] Care Management Care Management Coordinator Direct Line [phone] Toll Free: [phone]  Fax: [phone]

## 2017-06-05 ENCOUNTER — Telehealth: Payer: Self-pay
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
## 2017-06-05 NOTE — Telephone Encounter (Signed)
Notes reviewed. I stopped losartan 05/15/17, thyroid level normal 01/28/17 and [facility]. Recommend stay off losartan but restart Synthroid.

## 2017-06-06 DIAGNOSIS — T320 Corrosions involving less than 10% of body surface: Secondary | ICD-10-CM | POA: Diagnosis not present

## 2017-06-06 DIAGNOSIS — T23601D Corrosion of second degree of right hand, unspecified site, subsequent encounter: Secondary | ICD-10-CM | POA: Diagnosis not present

## 2017-06-06 DIAGNOSIS — T23292A Burn of second degree of multiple sites of left wrist and hand, initial encounter: Secondary | ICD-10-CM | POA: Diagnosis not present

## 2017-06-06 DIAGNOSIS — I10 Essential (primary) hypertension: Secondary | ICD-10-CM | POA: Diagnosis not present

## 2017-06-06 DIAGNOSIS — I471 Supraventricular tachycardia: Secondary | ICD-10-CM | POA: Diagnosis not present

## 2017-06-06 DIAGNOSIS — T23201A Burn of second degree of right hand, unspecified site, initial encounter: Secondary | ICD-10-CM | POA: Diagnosis not present

## 2017-06-06 DIAGNOSIS — E039 Hypothyroidism, unspecified: Secondary | ICD-10-CM | POA: Diagnosis not present

## 2017-06-06 DIAGNOSIS — T65891D Toxic effect of other specified substances, accidental (unintentional), subsequent encounter: Secondary | ICD-10-CM | POA: Diagnosis not present

## 2017-06-06 DIAGNOSIS — T23602D Corrosion of second degree of left hand, unspecified site, subsequent encounter: Secondary | ICD-10-CM | POA: Diagnosis not present

## 2017-06-06 DIAGNOSIS — M818 Other osteoporosis without current pathological fracture: Secondary | ICD-10-CM | POA: Diagnosis not present

## 2017-06-12 ENCOUNTER — Ambulatory Visit (INDEPENDENT_AMBULATORY_CARE_PROVIDER_SITE_OTHER): Payer: PPO | Admitting: Family Medicine

## 2017-06-12 ENCOUNTER — Encounter: Payer: Self-pay | Admitting: Family Medicine

## 2017-06-12 VITALS — BP 113/77 | HR 68 | Resp 16 | Ht 62.0 in | Wt 119.5 lb

## 2017-06-12 DIAGNOSIS — E559 Vitamin D deficiency, unspecified: Secondary | ICD-10-CM | POA: Diagnosis not present

## 2017-06-12 DIAGNOSIS — T23409D Corrosion of unspecified degree of unspecified hand, unspecified site, subsequent encounter: Secondary | ICD-10-CM

## 2017-06-12 DIAGNOSIS — E538 Deficiency of other specified B group vitamins: Secondary | ICD-10-CM

## 2017-06-12 DIAGNOSIS — E039 Hypothyroidism, unspecified: Secondary | ICD-10-CM

## 2017-06-12 DIAGNOSIS — I10 Essential (primary) hypertension: Secondary | ICD-10-CM | POA: Diagnosis not present
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
## 2017-06-13 ENCOUNTER — Ambulatory Visit: Payer: PPO | Admitting: Family Medicine

## 2017-06-13 DIAGNOSIS — T304 Corrosion of unspecified body region, unspecified degree: Secondary | ICD-10-CM | POA: Diagnosis not present

## 2017-06-13 DIAGNOSIS — I471 Supraventricular tachycardia: Secondary | ICD-10-CM | POA: Diagnosis not present

## 2017-06-13 DIAGNOSIS — M818 Other osteoporosis without current pathological fracture: Secondary | ICD-10-CM | POA: Diagnosis not present

## 2017-06-13 DIAGNOSIS — Z8673 Personal history of transient ischemic attack (TIA), and cerebral infarction without residual deficits: Secondary | ICD-10-CM | POA: Diagnosis not present

## 2017-06-13 DIAGNOSIS — T320 Corrosions involving less than 10% of body surface: Secondary | ICD-10-CM | POA: Diagnosis not present

## 2017-06-13 DIAGNOSIS — T23602A Corrosion of second degree of left hand, unspecified site, initial encounter: Secondary | ICD-10-CM | POA: Diagnosis not present

## 2017-06-13 DIAGNOSIS — I10 Essential (primary) hypertension: Secondary | ICD-10-CM | POA: Diagnosis not present

## 2017-06-13 DIAGNOSIS — E039 Hypothyroidism, unspecified: Secondary | ICD-10-CM | POA: Diagnosis not present

## 2017-06-13 DIAGNOSIS — T65891A Toxic effect of other specified substances, accidental (unintentional), initial encounter: Secondary | ICD-10-CM | POA: Diagnosis not present

## 2017-06-13 DIAGNOSIS — T23601A Corrosion of second degree of right hand, unspecified site, initial encounter: Secondary | ICD-10-CM | POA: Diagnosis not present

## 2017-06-13 DIAGNOSIS — T23292A Burn of second degree of multiple sites of left wrist and hand, initial encounter: Secondary | ICD-10-CM | POA: Diagnosis not present

## 2017-06-25 DIAGNOSIS — T310 Burns involving less than 10% of body surface: Secondary | ICD-10-CM | POA: Diagnosis not present

## 2017-06-25 DIAGNOSIS — M818 Other osteoporosis without current pathological fracture: Secondary | ICD-10-CM | POA: Diagnosis not present

## 2017-06-25 DIAGNOSIS — T304 Corrosion of unspecified body region, unspecified degree: Secondary | ICD-10-CM | POA: Diagnosis not present

## 2017-06-25 DIAGNOSIS — T23601D Corrosion of second degree of right hand, unspecified site, subsequent encounter: Secondary | ICD-10-CM | POA: Diagnosis not present

## 2017-06-25 DIAGNOSIS — T23602D Corrosion of second degree of left hand, unspecified site, subsequent encounter: Secondary | ICD-10-CM | POA: Diagnosis not present

## 2017-06-25 DIAGNOSIS — T23341D Burn of third degree of multiple right fingers (nail), including thumb, subsequent encounter: Secondary | ICD-10-CM | POA: Diagnosis not present

## 2017-06-25 DIAGNOSIS — I471 Supraventricular tachycardia: Secondary | ICD-10-CM | POA: Diagnosis not present

## 2017-06-25 DIAGNOSIS — I10 Essential (primary) hypertension: Secondary | ICD-10-CM | POA: Diagnosis not present

## 2017-06-25 DIAGNOSIS — T23292D Burn of second degree of multiple sites of left wrist and hand, subsequent encounter: Secondary | ICD-10-CM | POA: Diagnosis not present

## 2017-06-25 DIAGNOSIS — E039 Hypothyroidism, unspecified: Secondary | ICD-10-CM | POA: Diagnosis not present

## 2017-07-11 DIAGNOSIS — T23292A Burn of second degree of multiple sites of left wrist and hand, initial encounter: Secondary | ICD-10-CM | POA: Diagnosis not present

## 2017-07-11 DIAGNOSIS — I10 Essential (primary) hypertension: Secondary | ICD-10-CM | POA: Diagnosis not present

## 2017-07-11 DIAGNOSIS — E039 Hypothyroidism, unspecified: Secondary | ICD-10-CM | POA: Diagnosis not present

## 2017-07-11 DIAGNOSIS — I471 Supraventricular tachycardia: Secondary | ICD-10-CM | POA: Diagnosis not present

## 2017-07-11 DIAGNOSIS — T320 Corrosions involving less than 10% of body surface: Secondary | ICD-10-CM | POA: Diagnosis not present

## 2017-07-11 DIAGNOSIS — T65891D Toxic effect of other specified substances, accidental (unintentional), subsequent encounter: Secondary | ICD-10-CM | POA: Diagnosis not present

## 2017-07-11 DIAGNOSIS — T23692D Corrosion of second degree of multiple sites of left wrist and hand, subsequent encounter: Secondary | ICD-10-CM | POA: Diagnosis not present

## 2017-07-11 DIAGNOSIS — M818 Other osteoporosis without current pathological fracture: Secondary | ICD-10-CM | POA: Diagnosis not present

## 2017-07-11 DIAGNOSIS — T23601A Corrosion of second degree of right hand, unspecified site, initial encounter: Secondary | ICD-10-CM | POA: Diagnosis not present

## 2017-07-13 DIAGNOSIS — E039 Hypothyroidism, unspecified: Secondary | ICD-10-CM | POA: Diagnosis not present

## 2017-07-13 DIAGNOSIS — Z888 Allergy status to other drugs, medicaments and biological substances status: Secondary | ICD-10-CM | POA: Diagnosis not present

## 2017-07-13 DIAGNOSIS — I4891 Unspecified atrial fibrillation: Secondary | ICD-10-CM | POA: Diagnosis not present

## 2017-07-13 DIAGNOSIS — Z7982 Long term (current) use of aspirin: Secondary | ICD-10-CM | POA: Diagnosis not present

## 2017-07-13 DIAGNOSIS — M1990 Unspecified osteoarthritis, unspecified site: Secondary | ICD-10-CM | POA: Diagnosis not present

## 2017-07-13 DIAGNOSIS — Z885 Allergy status to narcotic agent status: Secondary | ICD-10-CM | POA: Diagnosis not present

## 2017-07-13 DIAGNOSIS — I10 Essential (primary) hypertension: Secondary | ICD-10-CM | POA: Diagnosis not present

## 2017-07-13 DIAGNOSIS — R531 Weakness: Secondary | ICD-10-CM | POA: Diagnosis not present

## 2017-07-13 DIAGNOSIS — Z79899 Other long term (current) drug therapy: Secondary | ICD-10-CM | POA: Diagnosis not present

## 2017-07-13 DIAGNOSIS — G319 Degenerative disease of nervous system, unspecified: Secondary | ICD-10-CM | POA: Diagnosis not present

## 2017-07-17 ENCOUNTER — Ambulatory Visit (INDEPENDENT_AMBULATORY_CARE_PROVIDER_SITE_OTHER): Payer: PPO | Admitting: Family Medicine

## 2017-07-17 ENCOUNTER — Encounter: Payer: Self-pay | Admitting: Family Medicine

## 2017-07-17 VITALS — BP 120/70 | HR 70 | Ht 62.0 in | Wt 110.0 lb

## 2017-07-17 DIAGNOSIS — F0390 Unspecified dementia without behavioral disturbance: Secondary | ICD-10-CM

## 2017-07-17 DIAGNOSIS — J989 Respiratory disorder, unspecified: Secondary | ICD-10-CM | POA: Diagnosis not present

## 2017-07-17 DIAGNOSIS — Z515 Encounter for palliative care: Secondary | ICD-10-CM

## 2017-07-17 DIAGNOSIS — T7840XA Allergy, unspecified, initial encounter: Secondary | ICD-10-CM

## 2017-07-17 MED ORDER — BENZONATATE 100 MG PO CAPS: 100.0000 mg | ORAL_CAPSULE | Freq: Three times a day (TID) | ORAL | 1 refills | Status: DC | PRN
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
## 2017-07-25 ENCOUNTER — Ambulatory Visit
Admission: RE | Admit: 2017-07-25 | Discharge: 2017-07-25 | Disposition: A | Discharge status: Home / Self Care | Payer: PPO | Source: Ambulatory Visit | Attending: Family Medicine | Admitting: Family Medicine

## 2017-07-25 ENCOUNTER — Ambulatory Visit (INDEPENDENT_AMBULATORY_CARE_PROVIDER_SITE_OTHER): Payer: PPO | Admitting: Family Medicine

## 2017-07-25 ENCOUNTER — Encounter: Payer: Self-pay | Admitting: Family Medicine

## 2017-07-25 VITALS — BP 110/70 | HR 72 | Temp 98.2°F | Ht 62.0 in | Wt 112.0 lb

## 2017-07-25 DIAGNOSIS — J40 Bronchitis, not specified as acute or chronic: Secondary | ICD-10-CM

## 2017-07-25 DIAGNOSIS — K449 Diaphragmatic hernia without obstruction or gangrene: Secondary | ICD-10-CM | POA: Insufficient documentation

## 2017-07-25 DIAGNOSIS — J449 Chronic obstructive pulmonary disease, unspecified: Secondary | ICD-10-CM | POA: Diagnosis not present

## 2017-07-25 DIAGNOSIS — T7840XA Allergy, unspecified, initial encounter: Secondary | ICD-10-CM

## 2017-07-25 DIAGNOSIS — J989 Respiratory disorder, unspecified: Secondary | ICD-10-CM

## 2017-07-25 DIAGNOSIS — I351 Nonrheumatic aortic (valve) insufficiency: Secondary | ICD-10-CM

## 2017-07-25 DIAGNOSIS — R05 Cough: Secondary | ICD-10-CM | POA: Diagnosis not present

## 2017-07-25 DIAGNOSIS — J0100 Acute maxillary sinusitis, unspecified: Secondary | ICD-10-CM | POA: Diagnosis not present

## 2017-07-25 MED ORDER — AMOXICILLIN 500 MG PO CAPS: 500.0000 mg | ORAL_CAPSULE | Freq: Three times a day (TID) | ORAL | 0 refills | Status: DC

## 2017-07-25 MED ORDER — BENZONATATE 100 MG PO CAPS: 100.0000 mg | ORAL_CAPSULE | Freq: Three times a day (TID) | ORAL | 1 refills | Status: DC | PRN
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
## 2017-08-06 DIAGNOSIS — R7309 Other abnormal glucose: Secondary | ICD-10-CM | POA: Diagnosis not present

## 2017-08-06 DIAGNOSIS — E559 Vitamin D deficiency, unspecified: Secondary | ICD-10-CM | POA: Diagnosis not present

## 2017-08-06 DIAGNOSIS — R413 Other amnesia: Secondary | ICD-10-CM | POA: Diagnosis not present

## 2017-08-06 DIAGNOSIS — E538 Deficiency of other specified B group vitamins: Secondary | ICD-10-CM | POA: Diagnosis not present

## 2017-08-12 ENCOUNTER — Telehealth: Payer: Self-pay

## 2017-08-12 DIAGNOSIS — Z87891 Personal history of nicotine dependence: Secondary | ICD-10-CM | POA: Diagnosis not present

## 2017-08-12 DIAGNOSIS — M5106 Intervertebral disc disorders with myelopathy, lumbar region: Secondary | ICD-10-CM | POA: Diagnosis not present

## 2017-08-12 DIAGNOSIS — F0390 Unspecified dementia without behavioral disturbance: Secondary | ICD-10-CM | POA: Diagnosis not present

## 2017-08-12 DIAGNOSIS — I10 Essential (primary) hypertension: Secondary | ICD-10-CM | POA: Diagnosis not present

## 2017-08-12 DIAGNOSIS — M810 Age-related osteoporosis without current pathological fracture: Secondary | ICD-10-CM | POA: Diagnosis not present

## 2017-08-12 DIAGNOSIS — Z8673 Personal history of transient ischemic attack (TIA), and cerebral infarction without residual deficits: Secondary | ICD-10-CM | POA: Diagnosis not present

## 2017-08-12 DIAGNOSIS — Z9181 History of falling: Secondary | ICD-10-CM | POA: Diagnosis not present

## 2017-08-12 NOTE — Telephone Encounter (Signed)
Physical therapy called wanting orders for PT- told to call ordering physician ( Dr Malvin JohnsPotter) as we didn't order PT?

## 2017-08-15 ENCOUNTER — Telehealth: Payer: Self-pay | Admitting: Family Medicine

## 2017-08-15 NOTE — Telephone Encounter (Signed)
Called to schedule Medicare Annual Wellness Visit with Nurse Health Advisor. If patient returns call, please sched AWv any date.  Thank you! For any questions please contact: Manuela SchwartzKathryn Mosley [phone]  Skype [email or web address]

## 2017-09-02 ENCOUNTER — Telehealth: Payer: Self-pay

## 2017-09-02 ENCOUNTER — Other Ambulatory Visit: Payer: Self-pay

## 2017-09-02 NOTE — Telephone Encounter (Signed)
[facility] "not being able to contact patient to set up appointment." I contacted Becky/ Daughter. I explianed that we had received the letter and asked if she knew that they were trying to reach them concerning appt. She said that she took her mom to the neurologist, but her mom didn't want to go to anymore doctors where they asked her questions and she couldn't answer them. I explained the difference between the neurologists and the geriatrics dept. She said her mom is depressed and she "understands why she doesn't want to go, but I will see what I can do." I told her to let us know what decision they had made.

## 2017-09-09 DIAGNOSIS — Z8673 Personal history of transient ischemic attack (TIA), and cerebral infarction without residual deficits: Secondary | ICD-10-CM | POA: Diagnosis not present

## 2017-09-09 DIAGNOSIS — Z87891 Personal history of nicotine dependence: Secondary | ICD-10-CM | POA: Diagnosis not present

## 2017-09-09 DIAGNOSIS — M810 Age-related osteoporosis without current pathological fracture: Secondary | ICD-10-CM | POA: Diagnosis not present

## 2017-09-09 DIAGNOSIS — I10 Essential (primary) hypertension: Secondary | ICD-10-CM | POA: Diagnosis not present

## 2017-09-09 DIAGNOSIS — Z9181 History of falling: Secondary | ICD-10-CM | POA: Diagnosis not present

## 2017-09-09 DIAGNOSIS — M5106 Intervertebral disc disorders with myelopathy, lumbar region: Secondary | ICD-10-CM | POA: Diagnosis not present

## 2017-09-09 DIAGNOSIS — F0390 Unspecified dementia without behavioral disturbance: Secondary | ICD-10-CM | POA: Diagnosis not present

## 2017-09-15 ENCOUNTER — Encounter: Payer: Self-pay | Admitting: Family Medicine

## 2017-09-15 ENCOUNTER — Ambulatory Visit (INDEPENDENT_AMBULATORY_CARE_PROVIDER_SITE_OTHER): Payer: PPO | Admitting: Family Medicine

## 2017-09-15 VITALS — BP 120/70 | HR 68 | Ht 62.0 in | Wt 112.0 lb

## 2017-09-15 DIAGNOSIS — W19XXXA Unspecified fall, initial encounter: Secondary | ICD-10-CM | POA: Diagnosis not present

## 2017-09-15 DIAGNOSIS — E039 Hypothyroidism, unspecified: Secondary | ICD-10-CM

## 2017-09-15 DIAGNOSIS — I10 Essential (primary) hypertension: Secondary | ICD-10-CM
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
## 2017-09-18 ENCOUNTER — Telehealth: Payer: Self-pay

## 2017-09-18 NOTE — Telephone Encounter (Signed)
Needs refill Remeron patient told daughter that she don't have to take it anymore. Also wants to get Aricept from PCP. Neurology wrote it but she does not want to go back to them. I explained Neurology wrote that and she said that was a consult only. I explained that if they give Rx they continue to treat the issue. Wanted to have Jones informed that they want to get that here. Pls advise on Aricept and Remeron.

## 2017-09-18 NOTE — Telephone Encounter (Signed)
!)   pt says she is sleeping well / med causes somnolence /prone to falls/ agree to stop remeron for time being. 2) Dr Daisy Blossompotter's note say she is to return in 3 mos for her initial visit with him

## 2017-09-21 DIAGNOSIS — R9089 Other abnormal findings on diagnostic imaging of central nervous system: Secondary | ICD-10-CM | POA: Diagnosis not present

## 2017-09-21 DIAGNOSIS — I493 Ventricular premature depolarization: Secondary | ICD-10-CM | POA: Diagnosis not present

## 2017-09-21 DIAGNOSIS — Z4682 Encounter for fitting and adjustment of non-vascular catheter: Secondary | ICD-10-CM | POA: Diagnosis not present

## 2017-09-21 DIAGNOSIS — I471 Supraventricular tachycardia: Secondary | ICD-10-CM | POA: Diagnosis not present

## 2017-09-21 DIAGNOSIS — I468 Cardiac arrest due to other underlying condition: Secondary | ICD-10-CM | POA: Diagnosis not present

## 2017-09-21 DIAGNOSIS — M818 Other osteoporosis without current pathological fracture: Secondary | ICD-10-CM | POA: Diagnosis not present

## 2017-09-21 DIAGNOSIS — Z515 Encounter for palliative care: Secondary | ICD-10-CM | POA: Diagnosis not present

## 2017-09-21 DIAGNOSIS — G9340 Encephalopathy, unspecified: Secondary | ICD-10-CM | POA: Diagnosis not present

## 2017-09-21 DIAGNOSIS — E872 Acidosis: Secondary | ICD-10-CM | POA: Diagnosis not present

## 2017-09-21 DIAGNOSIS — Z7982 Long term (current) use of aspirin: Secondary | ICD-10-CM | POA: Diagnosis not present

## 2017-09-21 DIAGNOSIS — R531 Weakness: Secondary | ICD-10-CM | POA: Diagnosis not present

## 2017-09-21 DIAGNOSIS — R569 Unspecified convulsions: Secondary | ICD-10-CM | POA: Diagnosis not present

## 2017-09-21 DIAGNOSIS — S29009A Unspecified injury of muscle and tendon of unspecified wall of thorax, initial encounter: Secondary | ICD-10-CM | POA: Diagnosis not present

## 2017-09-21 DIAGNOSIS — R0603 Acute respiratory distress: Secondary | ICD-10-CM | POA: Diagnosis not present

## 2017-09-21 DIAGNOSIS — Z66 Do not resuscitate: Secondary | ICD-10-CM | POA: Diagnosis not present

## 2017-09-21 DIAGNOSIS — R402212 Coma scale, best verbal response, none, at arrival to emergency department: Secondary | ICD-10-CM | POA: Diagnosis not present

## 2017-09-21 DIAGNOSIS — R4182 Altered mental status, unspecified: Secondary | ICD-10-CM | POA: Diagnosis not present

## 2017-09-21 DIAGNOSIS — I639 Cerebral infarction, unspecified: Secondary | ICD-10-CM | POA: Diagnosis not present

## 2017-09-21 DIAGNOSIS — I6523 Occlusion and stenosis of bilateral carotid arteries: Secondary | ICD-10-CM | POA: Diagnosis not present

## 2017-09-21 DIAGNOSIS — I214 Non-ST elevation (NSTEMI) myocardial infarction: Secondary | ICD-10-CM | POA: Diagnosis not present

## 2017-09-21 DIAGNOSIS — S0990XA Unspecified injury of head, initial encounter: Secondary | ICD-10-CM | POA: Diagnosis not present

## 2017-09-21 DIAGNOSIS — R402214 Coma scale, best verbal response, none, 24 hours or more after hospital admission: Secondary | ICD-10-CM | POA: Diagnosis not present

## 2017-09-21 DIAGNOSIS — S0993XA Unspecified injury of face, initial encounter: Secondary | ICD-10-CM | POA: Diagnosis not present

## 2017-09-21 DIAGNOSIS — R4701 Aphasia: Secondary | ICD-10-CM | POA: Diagnosis not present

## 2017-09-21 DIAGNOSIS — S199XXA Unspecified injury of neck, initial encounter: Secondary | ICD-10-CM | POA: Diagnosis not present

## 2017-09-21 DIAGNOSIS — I469 Cardiac arrest, cause unspecified: Secondary | ICD-10-CM | POA: Diagnosis not present

## 2017-09-21 DIAGNOSIS — S3993XA Unspecified injury of pelvis, initial encounter: Secondary | ICD-10-CM | POA: Diagnosis not present

## 2017-09-21 DIAGNOSIS — G9341 Metabolic encephalopathy: Secondary | ICD-10-CM | POA: Diagnosis not present

## 2017-09-21 DIAGNOSIS — G319 Degenerative disease of nervous system, unspecified: Secondary | ICD-10-CM | POA: Diagnosis not present

## 2017-09-21 DIAGNOSIS — Z9911 Dependence on respirator [ventilator] status: Secondary | ICD-10-CM | POA: Diagnosis not present

## 2017-09-21 DIAGNOSIS — R34 Anuria and oliguria: Secondary | ICD-10-CM | POA: Diagnosis not present

## 2017-09-21 DIAGNOSIS — K029 Dental caries, unspecified: Secondary | ICD-10-CM | POA: Diagnosis not present

## 2017-09-21 DIAGNOSIS — M6281 Muscle weakness (generalized): Secondary | ICD-10-CM | POA: Diagnosis not present

## 2017-09-21 DIAGNOSIS — G8191 Hemiplegia, unspecified affecting right dominant side: Secondary | ICD-10-CM | POA: Diagnosis not present

## 2017-09-21 DIAGNOSIS — I4891 Unspecified atrial fibrillation: Secondary | ICD-10-CM | POA: Diagnosis not present

## 2017-09-21 DIAGNOSIS — S299XXA Unspecified injury of thorax, initial encounter: Secondary | ICD-10-CM | POA: Diagnosis not present

## 2017-09-21 DIAGNOSIS — F0390 Unspecified dementia without behavioral disturbance: Secondary | ICD-10-CM | POA: Diagnosis not present

## 2017-09-21 DIAGNOSIS — F0391 Unspecified dementia with behavioral disturbance: Secondary | ICD-10-CM | POA: Diagnosis not present

## 2017-09-21 DIAGNOSIS — I63521 Cerebral infarction due to unspecified occlusion or stenosis of right anterior cerebral artery: Secondary | ICD-10-CM | POA: Diagnosis not present

## 2017-09-21 DIAGNOSIS — J9601 Acute respiratory failure with hypoxia: Secondary | ICD-10-CM | POA: Diagnosis not present

## 2017-09-21 DIAGNOSIS — R000 Tachycardia, unspecified: Secondary | ICD-10-CM | POA: Diagnosis not present

## 2017-09-21 DIAGNOSIS — E039 Hypothyroidism, unspecified: Secondary | ICD-10-CM | POA: Diagnosis not present

## 2017-09-21 DIAGNOSIS — R402114 Coma scale, eyes open, never, 24 hours or more after hospital admission: Secondary | ICD-10-CM | POA: Diagnosis not present

## 2017-09-21 DIAGNOSIS — J9600 Acute respiratory failure, unspecified whether with hypoxia or hypercapnia: Secondary | ICD-10-CM | POA: Diagnosis not present

## 2017-09-22 DIAGNOSIS — R569 Unspecified convulsions: Secondary | ICD-10-CM | POA: Diagnosis not present

## 2017-09-22 MED ORDER — INSULIN REGULAR HUMAN 100 UNIT/ML IJ SOLN
0.00 | INTRAMUSCULAR | Status: DC
Start: 2017-09-22 — End: 2017-09-22

## 2017-09-22 MED ORDER — FENTANYL CITRATE (PF) 2500 MCG/50ML IJ SOLN
25.00 | INTRAMUSCULAR | Status: DC
Start: ? — End: 2017-09-22

## 2017-09-22 MED ORDER — LEVOTHYROXINE SODIUM 100 MCG PO TABS
100.00 | ORAL_TABLET | ORAL | Status: DC
Start: 2017-09-23 — End: 2017-09-22

## 2017-09-22 MED ORDER — HYDRALAZINE HCL 20 MG/ML IJ SOLN
10.00 | INTRAMUSCULAR | Status: DC
Start: ? — End: 2017-09-22

## 2017-09-22 MED ORDER — FAMOTIDINE 20 MG PO TABS
20.00 | ORAL_TABLET | ORAL | Status: DC
Start: 2017-09-22 — End: 2017-09-22

## 2017-09-22 MED ORDER — ONDANSETRON HCL 4 MG/2ML IJ SOLN
4.00 | INTRAMUSCULAR | Status: DC
Start: ? — End: 2017-09-22

## 2017-09-22 MED ORDER — ENOXAPARIN SODIUM 40 MG/0.4ML ~~LOC~~ SOLN
40.00 | SUBCUTANEOUS | Status: DC
Start: 2017-09-23 — End: 2017-09-22

## 2017-09-22 MED ORDER — PROPOFOL 100 MG/10ML IV EMUL
0.00 | INTRAVENOUS | Status: DC
Start: ? — End: 2017-09-22

## 2017-09-22 MED ORDER — CHLORHEXIDINE GLUCONATE 0.12 % MT SOLN
5.00 | OROMUCOSAL | Status: DC
Start: 2017-09-22 — End: 2017-09-22

## 2017-09-22 MED ORDER — METOPROLOL SUCCINATE ER 25 MG PO TB24
25.00 | ORAL_TABLET | ORAL | Status: DC
Start: 2017-09-23 — End: 2017-09-22

## 2017-09-22 MED ORDER — DEXTROSE 10 % IV SOLN
12.50 | INTRAVENOUS | Status: DC
Start: ? — End: 2017-09-22

## 2017-09-22 MED ORDER — SODIUM CHLORIDE 0.9 % IV SOLN
.00 | INTRAVENOUS | Status: DC
Start: ? — End: 2017-09-22

## 2017-09-22 MED ORDER — ATORVASTATIN CALCIUM 80 MG PO TABS
80.00 | ORAL_TABLET | ORAL | Status: DC
Start: 2017-09-22 — End: 2017-09-22

## 2017-09-22 MED ORDER — ASPIRIN 81 MG PO CHEW
81.00 | CHEWABLE_TABLET | ORAL | Status: DC
Start: 2017-09-22 — End: 2017-09-22
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
## 2017-10-09 DEATH — deceased

## 2018-01-15 ENCOUNTER — Ambulatory Visit: Payer: PPO | Admitting: Family Medicine

## 2018-08-12 IMAGING — CT CT HEAD W/O CM
3 series · 15 of 44 positions shown, 18 images · non-contrast
Comparison: 12/22/2003 CT of the head.

CLINICAL DATA: 88 y/o F; elevated blood pressure with vomiting,
dizziness, and lightheadedness.

EXAM:
CT HEAD WITHOUT CONTRAST
TECHNIQUE: Contiguous axial images were obtained from the base of the skull
through the vertex without intravenous contrast.

[Series 2: head wo · axial · 0.41mm/px · z∈[-116,-6]mm · 9 of 27 slices shown, 12 images]
[im 3/27  brain]
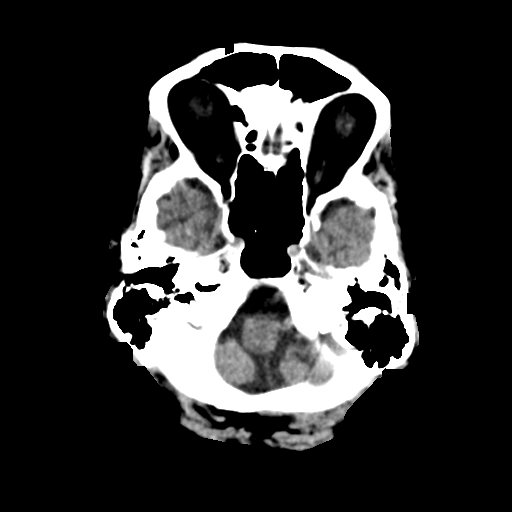
[im 3/27  bone]
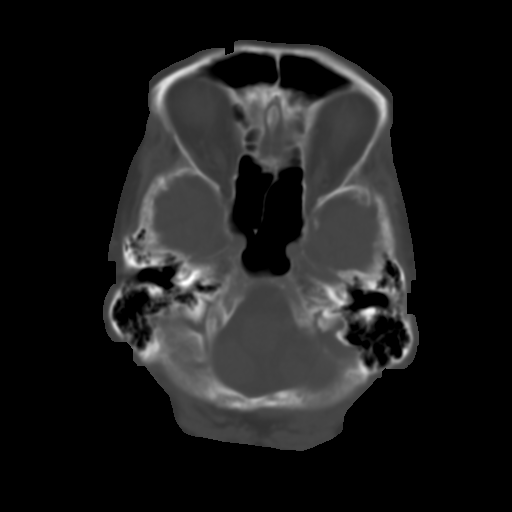
[im 6/27  brain]
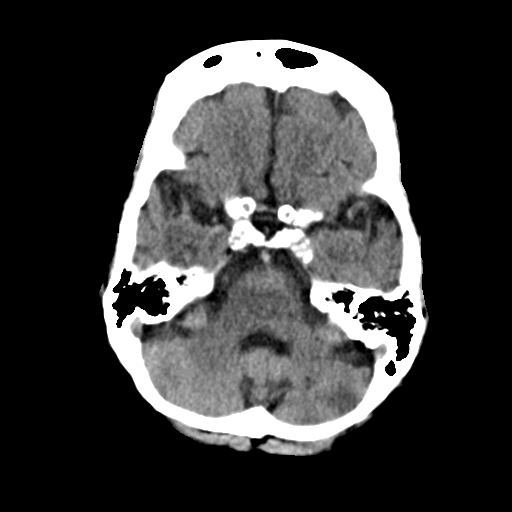
[im 8/27  brain]
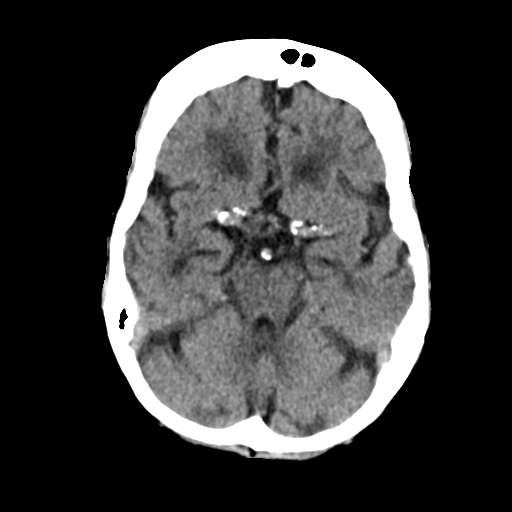
[im 11/27  brain]
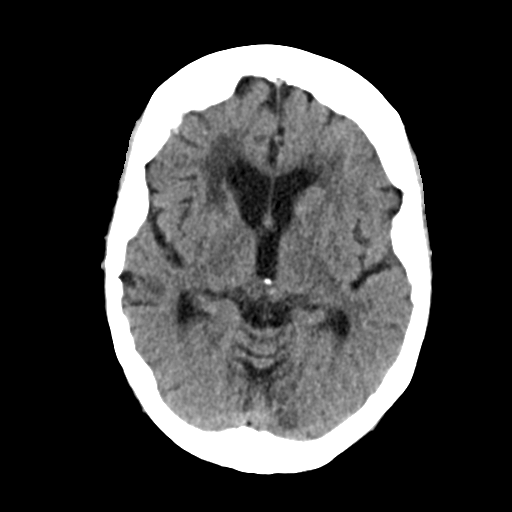
[im 14/27  brain]
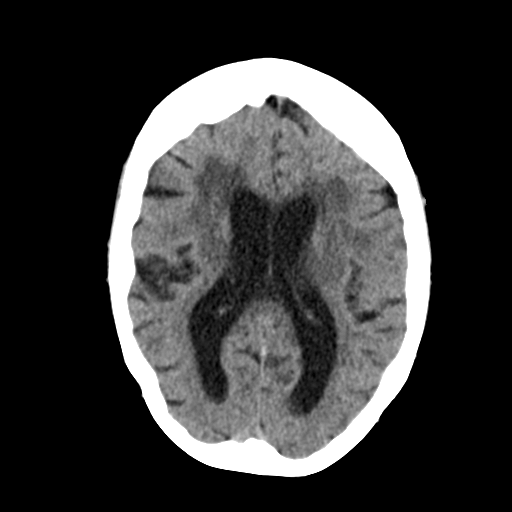
[im 14/27  bone]
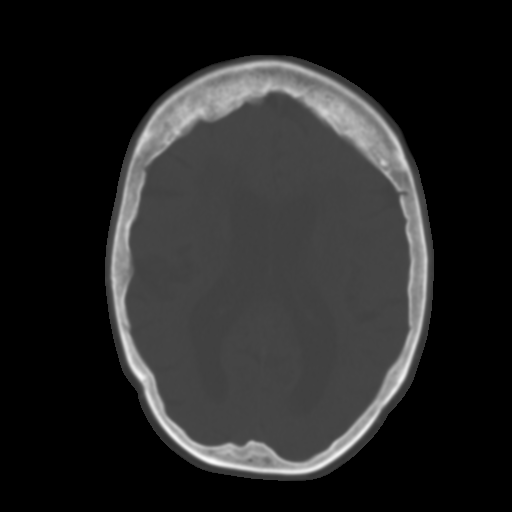
[im 17/27  brain]
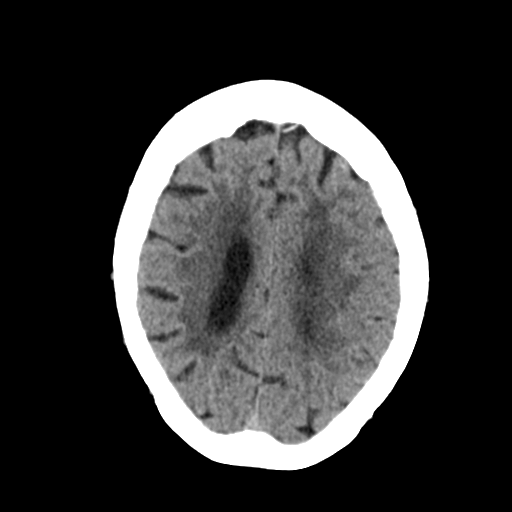
[im 20/27  brain]
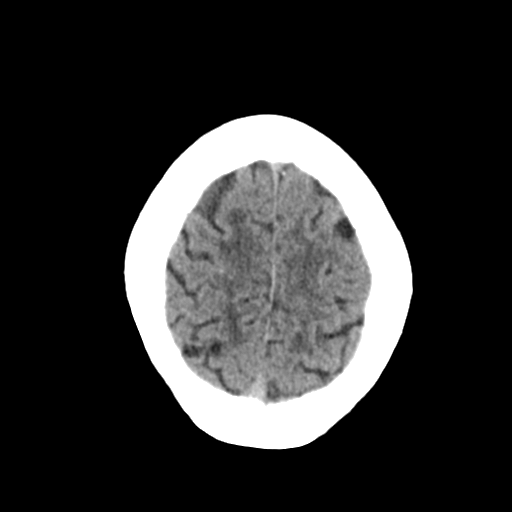
[im 22/27  brain]
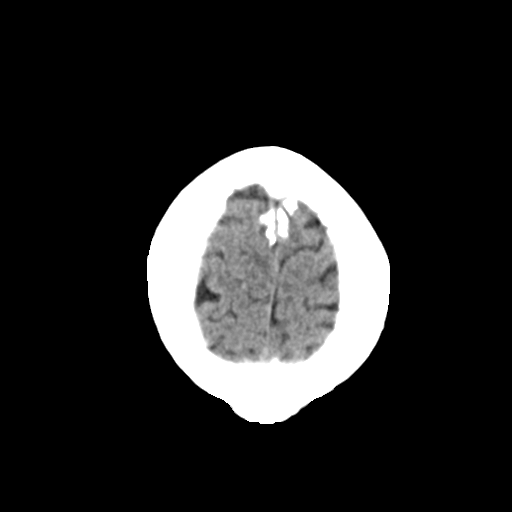
[im 25/27  brain]
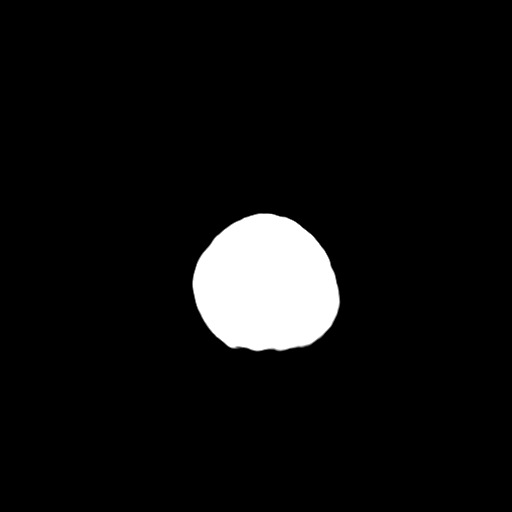
[im 25/27  bone]
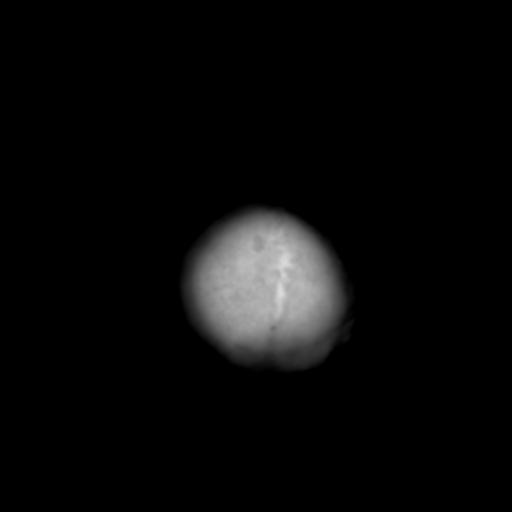

[Series 4: coronal soft tissue · coronal · 0.28mm/px · 3 of 60 slices shown]
[im 20/60  brain]
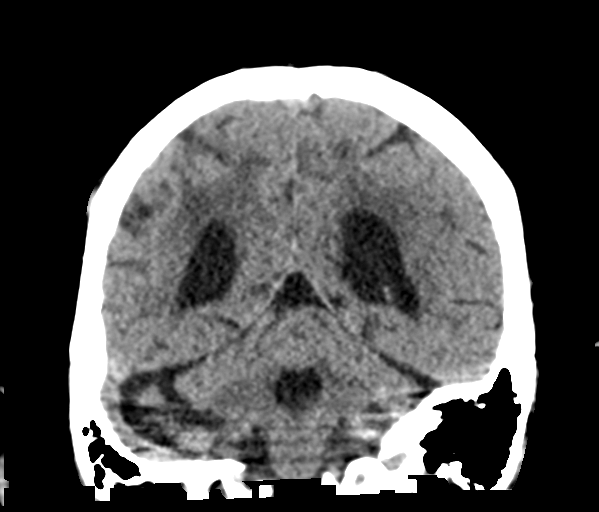
[im 27/60  brain]
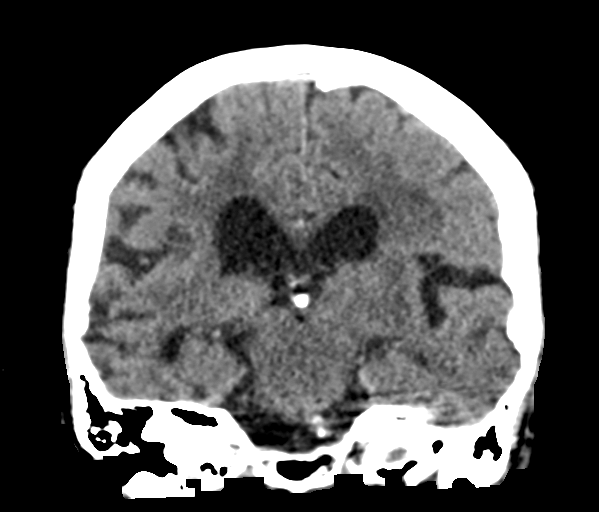
[im 33/60  brain]
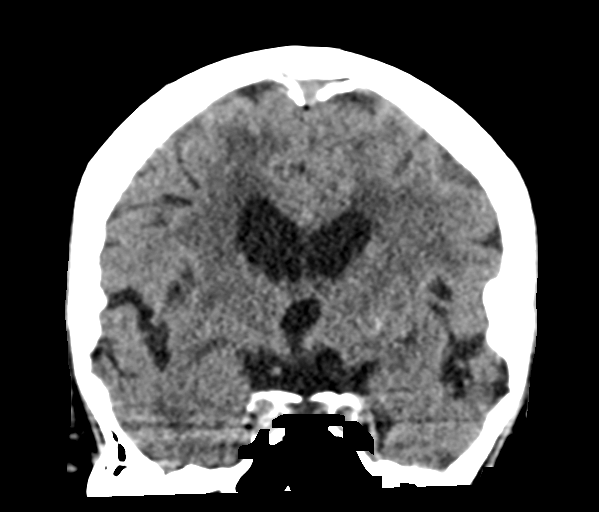

[Series 5: sagittal soft tissue · sagittal · 0.28mm/px · 3 of 44 slices shown]
[im 15/44  brain]
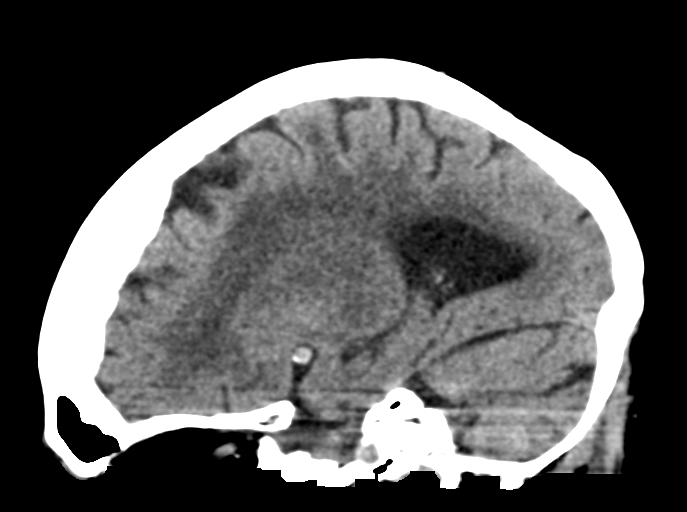
[im 22/44  brain]
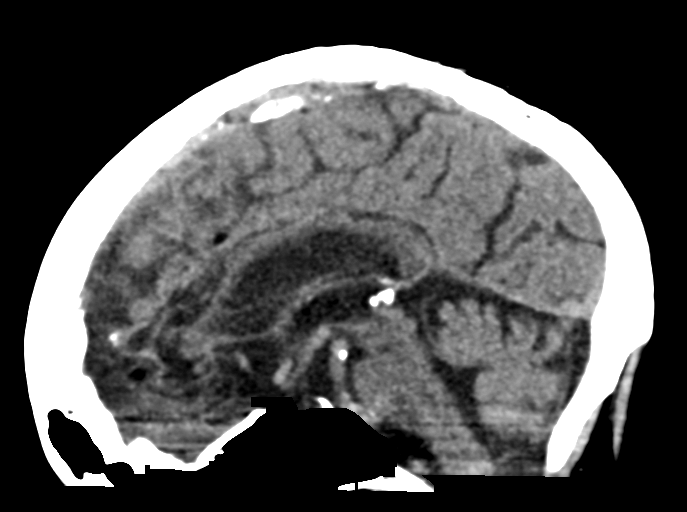
[im 29/44  brain]
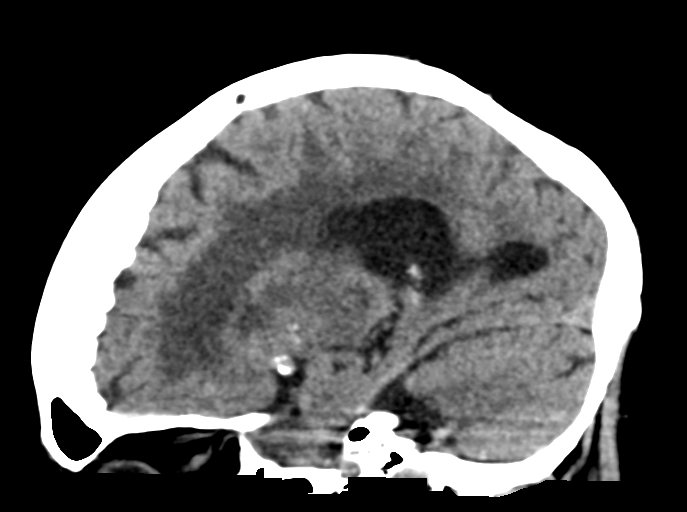

[15 of 44 positions shown; findings below may reference images not displayed]

FINDINGS: Brain: No evidence of acute infarction, hemorrhage, hydrocephalus,
extra-axial collection or mass lesion/mass effect. Interval small
chronic cortical infarct in the right parietal lobe. Advanced
chronic microvascular ischemic changes and moderate brain
parenchymal volume loss progressed from prior CT. Stable chronic
lacunar infarcts within the right anterior lentiform nucleus, right
thalamus.

Vascular: Extensive calcific atherosclerosis of cavernous and
paraclinoid internal carotid arteries as well as the vertebrobasilar
system.

Skull: Normal. Negative for fracture or focal lesion.

Sinuses/Orbits: No acute finding.

Other: None.
IMPRESSION: 1. No acute intracranial abnormality identified.
2. Progression of advanced chronic microvascular ischemic changes
and moderate parenchymal volume loss of the brain. Interval small
chronic cortical infarct in the right parietal lobe.

By: Cris Jay Adikari M.D.
